# Patient Record
Sex: Female | Born: 1975 | Hispanic: Yes | Marital: Married | Smoking: Never smoker
Health system: Southern US, Community
[De-identification: ages and names within clinical notes are randomized; demographics above are authoritative.]

## PROBLEM LIST (undated history)

## (undated) DIAGNOSIS — O24419 Gestational diabetes mellitus in pregnancy, unspecified control: Secondary | ICD-10-CM

## (undated) DIAGNOSIS — E785 Hyperlipidemia, unspecified: Secondary | ICD-10-CM

## (undated) HISTORY — DX: Gestational diabetes mellitus in pregnancy, unspecified control: O24.419

---

## 2009-09-21 HISTORY — PX: WISDOM TOOTH EXTRACTION: SHX21

## 2010-09-21 NOTE — L&D Delivery Note (Signed)
Delivery Note At 5:28 PM a viable female was delivered via Vaginal, Vacuum (Extractor) (Presentation: Right Occiput Anterior).  APGAR: 9, 9; weight 7 lb 10.8 oz (3480 g).   Placenta status: Intact, Spontaneous.  Cord: 3 vessels with the following complications: None.  Cord pH: not done  Anesthesia: Epidural Local  Episiotomy: Median Lacerations:  Suture Repair: 2.0 vicryl Est. Blood Loss (mL):   Mom to postpartum.  Baby to nursery-stable.  MARSHALL,BERNARD A 06/01/2011, 5:44 PM

## 2010-10-16 ENCOUNTER — Other Ambulatory Visit: Payer: Self-pay | Admitting: Obstetrics and Gynecology

## 2010-10-16 ENCOUNTER — Ambulatory Visit
Admission: RE | Admit: 2010-10-16 | Discharge: 2010-10-16 | Payer: Self-pay | Source: Home / Self Care | Attending: Obstetrics and Gynecology | Admitting: Obstetrics and Gynecology

## 2010-10-16 DIAGNOSIS — O3680X0 Pregnancy with inconclusive fetal viability, not applicable or unspecified: Secondary | ICD-10-CM

## 2010-10-17 NOTE — Progress Notes (Unsigned)
NAME:  Emma Beard, Emma Beard NO.:  [id number]  MEDICAL RECORD NO.:  [id number]          PATIENT TYPE:  WOC  LOCATION:  [facility]                   FACILITY:  [facility]  PHYSICIAN:  Argentina Donovan, MD        DATE OF BIRTH:  [birth year]  DATE OF SERVICE:  10/16/2010                                 CLINIC NOTE  The patient is a 35 year old Spanish-Speaking Hispanic female [facility] because of possible fibroids before we got a chance to see her and her pregnancy test which was positive.  She will be sent for an ultrasound to determine the dates in 2 day and referred back [facility] for care.  IMPRESSION:  Pregnancy.          ______________________________ Argentina Donovan, MD    PR/MEDQ  D:  10/16/2010  T:  10/17/2010  Job:  [id number]

## 2010-10-23 ENCOUNTER — Ambulatory Visit (HOSPITAL_COMMUNITY)
Admission: RE | Admit: 2010-10-23 | Discharge: 2010-10-23 | Disposition: A | Discharge status: Home / Self Care | Payer: Self-pay | Source: Ambulatory Visit | Attending: Obstetrics and Gynecology | Admitting: Obstetrics and Gynecology

## 2010-10-23 DIAGNOSIS — O09519 Supervision of elderly primigravida, unspecified trimester: Secondary | ICD-10-CM | POA: Insufficient documentation

## 2010-10-23 DIAGNOSIS — Z3689 Encounter for other specified antenatal screening: Secondary | ICD-10-CM | POA: Insufficient documentation

## 2010-10-23 DIAGNOSIS — O3680X0 Pregnancy with inconclusive fetal viability, not applicable or unspecified: Secondary | ICD-10-CM

## 2010-12-26 LAB — RPR: RPR: NONREACTIVE

## 2010-12-26 LAB — GC/CHLAMYDIA PROBE AMP, GENITAL
Chlamydia: NEGATIVE
Gonorrhea: NEGATIVE

## 2010-12-26 LAB — RUBELLA ANTIBODY, IGM: Rubella: IMMUNE

## 2010-12-26 LAB — HEPATITIS B SURFACE ANTIGEN: Hepatitis B Surface Ag: NEGATIVE

## 2011-02-19 ENCOUNTER — Other Ambulatory Visit (HOSPITAL_COMMUNITY): Payer: Self-pay | Admitting: Obstetrics

## 2011-02-19 DIAGNOSIS — O24419 Gestational diabetes mellitus in pregnancy, unspecified control: Secondary | ICD-10-CM

## 2011-03-03 ENCOUNTER — Other Ambulatory Visit (HOSPITAL_COMMUNITY): Payer: Self-pay | Admitting: Obstetrics

## 2011-03-03 ENCOUNTER — Ambulatory Visit (HOSPITAL_COMMUNITY)
Admission: RE | Admit: 2011-03-03 | Discharge: 2011-03-03 | Disposition: A | Discharge status: Home / Self Care | Payer: Self-pay | Source: Ambulatory Visit | Attending: Obstetrics | Admitting: Obstetrics

## 2011-03-03 DIAGNOSIS — Z1389 Encounter for screening for other disorder: Secondary | ICD-10-CM | POA: Insufficient documentation

## 2011-03-03 DIAGNOSIS — O358XX0 Maternal care for other (suspected) fetal abnormality and damage, not applicable or unspecified: Secondary | ICD-10-CM | POA: Insufficient documentation

## 2011-03-03 DIAGNOSIS — O24419 Gestational diabetes mellitus in pregnancy, unspecified control: Secondary | ICD-10-CM

## 2011-03-03 DIAGNOSIS — Z363 Encounter for antenatal screening for malformations: Secondary | ICD-10-CM | POA: Insufficient documentation

## 2011-03-03 DIAGNOSIS — O09519 Supervision of elderly primigravida, unspecified trimester: Secondary | ICD-10-CM | POA: Insufficient documentation

## 2011-03-03 DIAGNOSIS — O99810 Abnormal glucose complicating pregnancy: Secondary | ICD-10-CM | POA: Insufficient documentation

## 2011-03-12 ENCOUNTER — Ambulatory Visit (HOSPITAL_COMMUNITY)
Admission: RE | Admit: 2011-03-12 | Discharge: 2011-03-12 | Disposition: A | Discharge status: Home / Self Care | Payer: Self-pay | Source: Ambulatory Visit | Attending: Obstetrics | Admitting: Obstetrics

## 2011-03-12 ENCOUNTER — Ambulatory Visit (HOSPITAL_COMMUNITY): Admission: RE | Admit: 2011-03-12 | Payer: Self-pay | Source: Ambulatory Visit

## 2011-03-12 ENCOUNTER — Encounter: Payer: Self-pay | Attending: Obstetrics | Admitting: Dietician

## 2011-03-12 DIAGNOSIS — O99810 Abnormal glucose complicating pregnancy: Secondary | ICD-10-CM | POA: Insufficient documentation

## 2011-03-12 DIAGNOSIS — Z713 Dietary counseling and surveillance: Secondary | ICD-10-CM | POA: Insufficient documentation

## 2011-03-31 ENCOUNTER — Ambulatory Visit (HOSPITAL_COMMUNITY): Payer: Self-pay

## 2011-04-30 LAB — HIV ANTIBODY (ROUTINE TESTING W REFLEX): HIV: NONREACTIVE

## 2011-04-30 LAB — STREP B DNA PROBE: GBS: NEGATIVE

## 2011-05-26 ENCOUNTER — Telehealth (HOSPITAL_COMMUNITY): Payer: Self-pay | Admitting: *Deleted

## 2011-05-26 ENCOUNTER — Encounter (HOSPITAL_COMMUNITY): Payer: Self-pay | Admitting: *Deleted

## 2011-05-26 NOTE — Telephone Encounter (Signed)
Preadmission screen  

## 2011-05-27 ENCOUNTER — Encounter (HOSPITAL_COMMUNITY): Payer: Self-pay | Admitting: *Deleted

## 2011-05-31 ENCOUNTER — Inpatient Hospital Stay (HOSPITAL_COMMUNITY)
Admission: RE | Admit: 2011-05-31 | Discharge: 2011-06-03 | DRG: 775 | Disposition: A | Discharge status: Home / Self Care | Payer: Medicaid Other | Source: Ambulatory Visit | Attending: Obstetrics | Admitting: Obstetrics

## 2011-05-31 ENCOUNTER — Encounter (HOSPITAL_COMMUNITY): Payer: Self-pay

## 2011-05-31 DIAGNOSIS — O99814 Abnormal glucose complicating childbirth: Secondary | ICD-10-CM | POA: Diagnosis present

## 2011-05-31 DIAGNOSIS — O09519 Supervision of elderly primigravida, unspecified trimester: Secondary | ICD-10-CM | POA: Diagnosis present

## 2011-05-31 LAB — CBC
Hemoglobin: 10.9 g/dL — ABNORMAL LOW (ref 12.0–15.0)
MCHC: 32.7 g/dL (ref 30.0–36.0)
Platelets: 292 10*3/uL (ref 150–400)

## 2011-05-31 MED ORDER — TERBUTALINE SULFATE 1 MG/ML IJ SOLN: 0.2500 mg | Freq: Once | INTRAMUSCULAR | Status: AC | PRN

## 2011-05-31 MED ORDER — ONDANSETRON HCL 4 MG/2ML IJ SOLN: 4.0000 mg | Freq: Four times a day (QID) | INTRAMUSCULAR | Status: DC | PRN

## 2011-05-31 MED ORDER — LACTATED RINGERS IV SOLN: 500.0000 mL | INTRAVENOUS | Status: DC | PRN

## 2011-05-31 MED ORDER — OXYCODONE-ACETAMINOPHEN 5-325 MG PO TABS
2.0000 | ORAL_TABLET | ORAL | Status: DC | PRN
  Administered 2011-06-02: 1 via ORAL

## 2011-05-31 MED ORDER — FLEET ENEMA 7-19 GM/118ML RE ENEM: 1.0000 | ENEMA | RECTAL | Status: DC | PRN

## 2011-05-31 MED ORDER — LIDOCAINE HCL (PF) 1 % IJ SOLN
30.0000 mL | INTRAMUSCULAR | Status: DC | PRN
  Filled 2011-05-31: qty 30

## 2011-05-31 MED ORDER — LACTATED RINGERS IV SOLN
INTRAVENOUS | Status: DC
  Administered 2011-05-31: 21:00:00 via INTRAVENOUS
  Administered 2011-06-01: 125 mL/h via INTRAVENOUS
  Administered 2011-06-01: 999 mL/h via INTRAVENOUS

## 2011-05-31 MED ORDER — ZOLPIDEM TARTRATE 10 MG PO TABS: 10.0000 mg | ORAL_TABLET | Freq: Every evening | ORAL | Status: DC | PRN

## 2011-05-31 MED ORDER — CITRIC ACID-SODIUM CITRATE 334-500 MG/5ML PO SOLN: 30.0000 mL | ORAL | Status: DC | PRN

## 2011-05-31 MED ORDER — OXYTOCIN BOLUS FROM INFUSION
500.0000 mL | Freq: Once | INTRAVENOUS | Status: DC
  Filled 2011-05-31: qty 1000
  Filled 2011-05-31: qty 500

## 2011-05-31 MED ORDER — MISOPROSTOL 25 MCG QUARTER TABLET
25.0000 ug | ORAL_TABLET | ORAL | Status: DC | PRN
  Administered 2011-05-31 – 2011-06-01 (×2): 25 ug via VAGINAL
  Filled 2011-05-31 (×2): qty 0.25

## 2011-05-31 MED ORDER — IBUPROFEN 600 MG PO TABS: 600.0000 mg | ORAL_TABLET | Freq: Four times a day (QID) | ORAL | Status: DC | PRN

## 2011-05-31 MED ORDER — ACETAMINOPHEN 325 MG PO TABS: 650.0000 mg | ORAL_TABLET | ORAL | Status: DC | PRN

## 2011-05-31 NOTE — Progress Notes (Signed)
Called MD for orders for IOL. Orders received.

## 2011-06-01 ENCOUNTER — Inpatient Hospital Stay (HOSPITAL_COMMUNITY): Payer: Medicaid Other | Admitting: Anesthesiology

## 2011-06-01 ENCOUNTER — Encounter (HOSPITAL_COMMUNITY): Payer: Self-pay | Admitting: Anesthesiology

## 2011-06-01 LAB — RPR: RPR Ser Ql: NONREACTIVE

## 2011-06-01 LAB — GLUCOSE, CAPILLARY: Glucose-Capillary: 73 mg/dL (ref 70–99)

## 2011-06-01 MED ORDER — OXYCODONE-ACETAMINOPHEN 5-325 MG PO TABS: 1.0000 | ORAL_TABLET | ORAL | Status: DC | PRN

## 2011-06-01 MED ORDER — FENTANYL 2.5 MCG/ML BUPIVACAINE 1/10 % EPIDURAL INFUSION (WH - ANES)
14.0000 mL/h | INTRAMUSCULAR | Status: DC
  Administered 2011-06-01 (×2): 14 mL/h via EPIDURAL
  Filled 2011-06-01 (×2): qty 60

## 2011-06-01 MED ORDER — PHENYLEPHRINE 40 MCG/ML (10ML) SYRINGE FOR IV PUSH (FOR BLOOD PRESSURE SUPPORT)
80.0000 ug | PREFILLED_SYRINGE | INTRAVENOUS | Status: DC | PRN
  Filled 2011-06-01: qty 5

## 2011-06-01 MED ORDER — ONDANSETRON HCL 4 MG/2ML IJ SOLN: 4.0000 mg | INTRAMUSCULAR | Status: DC | PRN

## 2011-06-01 MED ORDER — BENZOCAINE-MENTHOL 20-0.5 % EX AERO: 1.0000 "application " | INHALATION_SPRAY | CUTANEOUS | Status: DC | PRN

## 2011-06-01 MED ORDER — DIBUCAINE 1 % RE OINT: 1.0000 "application " | TOPICAL_OINTMENT | RECTAL | Status: DC | PRN

## 2011-06-01 MED ORDER — LACTATED RINGERS IV SOLN: 500.0000 mL | Freq: Once | INTRAVENOUS | Status: DC

## 2011-06-01 MED ORDER — DIPHENHYDRAMINE HCL 50 MG/ML IJ SOLN: 12.5000 mg | INTRAMUSCULAR | Status: DC | PRN

## 2011-06-01 MED ORDER — DIPHENHYDRAMINE HCL 25 MG PO CAPS: 25.0000 mg | ORAL_CAPSULE | Freq: Four times a day (QID) | ORAL | Status: DC | PRN

## 2011-06-01 MED ORDER — FERROUS SULFATE 325 (65 FE) MG PO TABS
325.0000 mg | ORAL_TABLET | Freq: Two times a day (BID) | ORAL | Status: DC
  Administered 2011-06-02 – 2011-06-03 (×3): 325 mg via ORAL
  Filled 2011-06-01 (×3): qty 1

## 2011-06-01 MED ORDER — EPHEDRINE 5 MG/ML INJ
10.0000 mg | INTRAVENOUS | Status: DC | PRN
  Filled 2011-06-01: qty 4

## 2011-06-01 MED ORDER — OXYTOCIN 20 UNITS IN LACTATED RINGERS INFUSION - SIMPLE
999.0000 mL/h | Freq: Once | INTRAVENOUS | Status: AC
  Administered 2011-06-01: 999 mL/h via INTRAVENOUS

## 2011-06-01 MED ORDER — WITCH HAZEL-GLYCERIN EX PADS: 1.0000 "application " | MEDICATED_PAD | CUTANEOUS | Status: DC | PRN

## 2011-06-01 MED ORDER — ZOLPIDEM TARTRATE 5 MG PO TABS: 5.0000 mg | ORAL_TABLET | Freq: Every evening | ORAL | Status: DC | PRN

## 2011-06-01 MED ORDER — PRENATAL PLUS 27-1 MG PO TABS
1.0000 | ORAL_TABLET | Freq: Every day | ORAL | Status: DC
  Administered 2011-06-02 – 2011-06-03 (×2): 1 via ORAL
  Filled 2011-06-01 (×3): qty 1

## 2011-06-01 MED ORDER — EPHEDRINE 5 MG/ML INJ: 10.0000 mg | INTRAVENOUS | Status: DC | PRN

## 2011-06-01 MED ORDER — IBUPROFEN 600 MG PO TABS
600.0000 mg | ORAL_TABLET | Freq: Four times a day (QID) | ORAL | Status: DC
  Administered 2011-06-01 – 2011-06-03 (×7): 600 mg via ORAL
  Filled 2011-06-01 (×7): qty 1

## 2011-06-01 MED ORDER — LANOLIN HYDROUS EX OINT: TOPICAL_OINTMENT | CUTANEOUS | Status: DC | PRN

## 2011-06-01 MED ORDER — PHENYLEPHRINE 40 MCG/ML (10ML) SYRINGE FOR IV PUSH (FOR BLOOD PRESSURE SUPPORT): 80.0000 ug | PREFILLED_SYRINGE | INTRAVENOUS | Status: DC | PRN

## 2011-06-01 MED ORDER — TETANUS-DIPHTH-ACELL PERTUSSIS 5-2.5-18.5 LF-MCG/0.5 IM SUSP
0.5000 mL | Freq: Once | INTRAMUSCULAR | Status: AC
  Administered 2011-06-02: 0.5 mL via INTRAMUSCULAR
  Filled 2011-06-01: qty 0.5

## 2011-06-01 MED ORDER — SIMETHICONE 80 MG PO CHEW: 80.0000 mg | CHEWABLE_TABLET | ORAL | Status: DC | PRN

## 2011-06-01 MED ORDER — ONDANSETRON HCL 4 MG PO TABS: 4.0000 mg | ORAL_TABLET | ORAL | Status: DC | PRN

## 2011-06-01 MED ORDER — LIDOCAINE HCL 1.5 % IJ SOLN
INTRAMUSCULAR | Status: DC | PRN
  Administered 2011-06-01 (×2): 5 mL via EPIDURAL

## 2011-06-01 MED ORDER — SENNOSIDES-DOCUSATE SODIUM 8.6-50 MG PO TABS
2.0000 | ORAL_TABLET | Freq: Every day | ORAL | Status: DC
  Administered 2011-06-02: 2 via ORAL

## 2011-06-01 NOTE — Anesthesia Preprocedure Evaluation (Signed)
Anesthesia Evaluation  Name, MR# and DOB Patient awake  General Assessment Comment  Reviewed: Allergy & Precautions, H&P , NPO status , Patient's Chart, lab work & pertinent test results, reviewed documented beta blocker date and time   History of Anesthesia Complications Negative for: history of anesthetic complications  Airway Mallampati: II TM Distance: >3 FB Neck ROM: full    Dental  (+) Teeth Intact   Pulmonary  clear to auscultation  breath sounds clear to auscultation none    Cardiovascular regular Normal    Neuro/Psych Negative Neurological ROS  Negative Psych ROS  GI/Hepatic/Renal negative GI ROS  negative Liver ROS  negative Renal ROS        Endo/Other  (+) Diabetes mellitus-, Gestational,     Abdominal   Musculoskeletal   Hematology negative hematology ROS (+)   Peds  Reproductive/Obstetrics (+) Pregnancy    Anesthesia Other Findings             Anesthesia Physical Anesthesia Plan  ASA: II  Anesthesia Plan: Epidural   Post-op Pain Management:    Induction:   Airway Management Planned:   Additional Equipment:   Intra-op Plan:   Post-operative Plan:   Informed Consent: I have reviewed the patients History and Physical, chart, labs and discussed the procedure including the risks, benefits and alternatives for the proposed anesthesia with the patient or authorized representative who has indicated his/her understanding and acceptance.     Plan Discussed with:   Anesthesia Plan Comments:         Anesthesia Quick Evaluation

## 2011-06-01 NOTE — Progress Notes (Signed)
  Cervix is now 9 cm with the vertex at a plus one station Reactive tracing

## 2011-06-01 NOTE — Anesthesia Procedure Notes (Signed)
Epidural Patient location during procedure: OB Start time: 06/01/2011 8:01 AM Reason for block: procedure for pain  Staffing Performed by: anesthesiologist   Preanesthetic Checklist Completed: patient identified, site marked, surgical consent, pre-op evaluation, timeout performed, IV checked, risks and benefits discussed and monitors and equipment checked  Epidural Patient position: sitting Prep: site prepped and draped and DuraPrep Patient monitoring: continuous pulse ox and blood pressure Approach: midline Injection technique: LOR air  Needle:  Needle type: Tuohy  Needle gauge: 17 G Needle length: 9 cm Needle insertion depth: 4 cm Catheter type: closed end flexible Catheter size: 19 Gauge Catheter at skin depth: 9 cm Test dose: negative  Assessment Events: blood not aspirated, injection not painful, no injection resistance, negative IV test and no paresthesia  Additional Notes Discussed risk of headache, infection, bleeding, nerve injury and failed or incomplete block.  Patient voices understanding and wishes to proceed.  Consent and procedure with interpreter present.

## 2011-06-01 NOTE — Progress Notes (Addendum)
Spanish interpreter, Albertina Senegal in room helping translate for RN and pt. Rn giving pt instructions to help with pushing. Plan is to start pushing

## 2011-06-01 NOTE — H&P (Signed)
This is Dr. Francoise Ceo dictating the history and physical on  Emma Beard she's a 35 year old gravida 1 EDC 9 9/12 she's a gestational diabetic Controlled at that was brought in for induction She received a him Cytotec x2 on admission and now her cervix is 3 cm 90% with the vertex at 0 station Amniotomy was performed and the fluid was thin meconium She's a negative GBS unknown known allergies Past medical history as stated above a gestational diabetes with normal she does Past surgical history negative Social history not negative System review negative Physical exam Well-developed female in no distress HEENT negative Lungs clear Heart regular rhythm no murmurs no gallops Breasts negative Abdomen term Extremities negative and

## 2011-06-01 NOTE — Progress Notes (Signed)
RN discussed with MD pt's sve and FHR baseline change. Plan is to start pushing.

## 2011-06-02 ENCOUNTER — Encounter (HOSPITAL_COMMUNITY): Payer: Self-pay

## 2011-06-02 LAB — CBC
HCT: 30.7 % — ABNORMAL LOW (ref 36.0–46.0)
RDW: 15.3 % (ref 11.5–15.5)
WBC: 19.1 10*3/uL — ABNORMAL HIGH (ref 4.0–10.5)

## 2011-06-02 LAB — ABO/RH: ABO/RH(D): O POS

## 2011-06-02 MED ORDER — INFLUENZA VIRUS VACC SPLIT PF IM SUSP
0.5000 mL | Freq: Once | INTRAMUSCULAR | Status: AC
  Administered 2011-06-02: 0.5 mL via INTRAMUSCULAR
  Filled 2011-06-02: qty 0.5

## 2011-06-02 NOTE — Progress Notes (Signed)
UR chart review completed.  

## 2011-06-02 NOTE — Anesthesia Postprocedure Evaluation (Signed)
  Anesthesia Post-op Note  Patient: Emma Beard  Procedure(s) Performed: * No procedures listed *  Patient Location: PACU and Mother/Baby  Anesthesia Type: Epidural  Level of Consciousness: awake, alert  and oriented  Airway and Oxygen Therapy: Patient Spontanous Breathing  Post-op Pain: none  Post-op Assessment: Post-op Vital signs reviewed and Patient's Cardiovascular Status Stable  Post-op Vital Signs: Reviewed and stable  Complications: No apparent anesthesia complications

## 2011-06-02 NOTE — Progress Notes (Signed)
Postpartum day one Vital signs normal Fundus firm Lochia moderate Legs negative No complaints    Postpartum day one Vital signs normal Fundus firm Lochia moderate Legs negative No complaints

## 2011-06-03 NOTE — Discharge Summary (Signed)
Obstetric Discharge Summary Reason for Admission: onset of labor Prenatal Procedures: none Intrapartum Procedures: spontaneous vaginal delivery Postpartum Procedures: none Complications-Operative and Postpartum: none Hemoglobin  Date Value Range Status  06/02/2011 9.8* 12.0-15.0 (g/dL) Final     HCT  Date Value Range Status  06/02/2011 30.7* 36.0-46.0 (%) Final    Discharge Diagnoses: Term Pregnancy-delivered  Discharge Information: Date: 06/03/2011 Activity: pelvic rest Diet: routine Medications: Tylenol #3 Condition: stable Instructions: refer to practice specific booklet Discharge to: home   Newborn Data: Live born female  Birth Weight: 7 lb 10.8 oz (3480 g) APGAR: 9, 9  Home with mother.  MARSHALL,BERNARD A 06/03/2011, 7:15 AM

## 2011-06-12 ENCOUNTER — Encounter (HOSPITAL_COMMUNITY): Payer: Self-pay

## 2011-06-12 NOTE — Progress Notes (Signed)
Encounter addended by: Catha Nottingham, RN on: 06/12/2011  8:29 AM<BR>     Documentation filed: Chief Complaint Section

## 2011-09-11 ENCOUNTER — Emergency Department (HOSPITAL_COMMUNITY)
Admission: EM | Admit: 2011-09-11 | Discharge: 2011-09-11 | Disposition: A | Discharge status: Home / Self Care | Payer: Self-pay | Attending: Emergency Medicine | Admitting: Emergency Medicine

## 2011-09-11 ENCOUNTER — Encounter (HOSPITAL_COMMUNITY): Payer: Self-pay | Admitting: *Deleted

## 2011-09-11 ENCOUNTER — Emergency Department (HOSPITAL_COMMUNITY): Payer: Self-pay

## 2011-09-11 DIAGNOSIS — R10819 Abdominal tenderness, unspecified site: Secondary | ICD-10-CM | POA: Insufficient documentation

## 2011-09-11 DIAGNOSIS — R109 Unspecified abdominal pain: Secondary | ICD-10-CM | POA: Insufficient documentation

## 2011-09-11 DIAGNOSIS — M549 Dorsalgia, unspecified: Secondary | ICD-10-CM | POA: Insufficient documentation

## 2011-09-11 DIAGNOSIS — K8020 Calculus of gallbladder without cholecystitis without obstruction: Secondary | ICD-10-CM | POA: Insufficient documentation

## 2011-09-11 LAB — DIFFERENTIAL
Eosinophils Absolute: 0 10*3/uL (ref 0.0–0.7)
Eosinophils Relative: 0 % (ref 0–5)
Lymphocytes Relative: 16 % (ref 12–46)
Lymphs Abs: 1.9 10*3/uL (ref 0.7–4.0)
Monocytes Absolute: 0.7 10*3/uL (ref 0.1–1.0)

## 2011-09-11 LAB — COMPREHENSIVE METABOLIC PANEL
BUN: 15 mg/dL (ref 6–23)
CO2: 23 mEq/L (ref 19–32)
Calcium: 9.5 mg/dL (ref 8.4–10.5)
Creatinine, Ser: 0.62 mg/dL (ref 0.50–1.10)
GFR calc Af Amer: >90 mL/min (ref >90)
GFR calc non Af Amer: >90 mL/min (ref >90)
Glucose, Bld: 121 mg/dL — ABNORMAL HIGH (ref 70–99)
Total Protein: 6.8 g/dL (ref 6.0–8.3)

## 2011-09-11 LAB — CBC
HCT: 36.6 % (ref 36.0–46.0)
MCH: 24.2 pg — ABNORMAL LOW (ref 26.0–34.0)
MCV: 74.5 fL — ABNORMAL LOW (ref 78.0–100.0)
RBC: 4.91 MIL/uL (ref 3.87–5.11)
WBC: 12.3 10*3/uL — ABNORMAL HIGH (ref 4.0–10.5)

## 2011-09-11 LAB — URINALYSIS, ROUTINE W REFLEX MICROSCOPIC
Bilirubin Urine: NEGATIVE
Ketones, ur: NEGATIVE mg/dL
Leukocytes, UA: NEGATIVE
Nitrite: NEGATIVE
Specific Gravity, Urine: 1.022 (ref 1.005–1.030)
Urobilinogen, UA: 0.2 mg/dL (ref 0.0–1.0)
pH: 5 (ref 5.0–8.0)

## 2011-09-11 LAB — URINE MICROSCOPIC-ADD ON

## 2011-09-11 LAB — LIPASE, BLOOD: Lipase: 55 U/L (ref 11–59)

## 2011-09-11 MED ORDER — OXYCODONE-ACETAMINOPHEN 5-325 MG PO TABS
1.0000 | ORAL_TABLET | Freq: Once | ORAL | Status: AC
  Administered 2011-09-11: 1 via ORAL
  Filled 2011-09-11: qty 1

## 2011-09-11 MED ORDER — IBUPROFEN 600 MG PO TABS: 600.0000 mg | ORAL_TABLET | Freq: Four times a day (QID) | ORAL | Status: AC | PRN

## 2011-09-11 MED ORDER — ONDANSETRON 8 MG PO TBDP: 8.0000 mg | ORAL_TABLET | Freq: Three times a day (TID) | ORAL | Status: DC | PRN

## 2011-09-11 MED ORDER — ONDANSETRON 4 MG PO TBDP
8.0000 mg | ORAL_TABLET | Freq: Once | ORAL | Status: AC
  Administered 2011-09-11: 8 mg via ORAL
  Filled 2011-09-11: qty 1

## 2011-09-11 MED ORDER — HYDROCODONE-ACETAMINOPHEN 5-500 MG PO TABS: 1.0000 | ORAL_TABLET | Freq: Four times a day (QID) | ORAL | Status: DC | PRN

## 2011-09-11 MED ORDER — IBUPROFEN 200 MG PO TABS
600.0000 mg | ORAL_TABLET | Freq: Once | ORAL | Status: AC
  Administered 2011-09-11: 600 mg via ORAL
  Filled 2011-09-11: qty 2
  Filled 2011-09-11: qty 1

## 2011-09-11 NOTE — ED Notes (Signed)
Had pain since having a baby 3 months ago.

## 2011-09-11 NOTE — ED Notes (Signed)
Per interpreter phone.  Pt woke up at 1am with generalized abdominal pain and back pain.  Pt denies vomiting or diarrhea.  Denies urinary symptoms.  Ambulatory without difficulty.  No distress noted.

## 2011-09-11 NOTE — ED Provider Notes (Signed)
History     CSN: [id number]  Arrival date & time 09/11/11  0509   First MD Initiated Contact with Patient 09/11/11 [phone]      Chief Complaint  Patient presents with  . Abdominal Pain    (Consider location/radiation/quality/duration/timing/severity/associated sxs/prior treatment) Patient is a 35 y.o. female presenting with abdominal pain. The history is provided by the patient.  Abdominal Pain The primary symptoms of the illness include abdominal pain. The primary symptoms of the illness do not include fever, shortness of breath, nausea, vomiting, diarrhea or dysuria. The current episode started 3 to 5 hours ago. The onset of the illness was sudden. The problem has been gradually improving.  The illness is associated with awakening from sleep. The patient states that she believes she is currently not pregnant. Additional symptoms associated with the illness include back pain. Symptoms associated with the illness do not include chills, anorexia, constipation, urgency or frequency.  Pt reports generalized abdominal and back pain in the middle of the night. States most of the pain however is on the right upper abdomen and right back. States she has had this type of pain in the past few times, but never this severe. States these episodes started 3 mon ago after she delivered her child. Denies fever, chill, nausea, vomiting, urinary symptoms, diarrhea, constipation  Past Medical History  Diagnosis Date  . Gestational diabetes     Past Surgical History  Procedure Date  . Wisdom tooth extraction 2011    Family History  Problem Relation Age of Onset  . Diabetes Mother     died from diabetes  . Heart disease Father     died from heart murmur  . Diabetes Brother     died from diabetes    History  Substance Use Topics  . Smoking status: Never Smoker   . Smokeless tobacco: Not on file  . Alcohol Use: No    OB History    Grav Para Term Preterm Abortions TAB SAB Ect Mult Living   1  1 1       1       Review of Systems  Constitutional: Negative for fever and chills.  HENT: Negative.   Eyes: Negative.   Respiratory: Negative for cough, chest tightness and shortness of breath.   Cardiovascular: Negative for chest pain.  Gastrointestinal: Positive for abdominal pain. Negative for nausea, vomiting, diarrhea, constipation and anorexia.  Genitourinary: Positive for flank pain. Negative for dysuria, urgency and frequency.  Musculoskeletal: Positive for back pain.  Neurological: Negative.   Psychiatric/Behavioral: Negative.     Allergies  Review of patient's allergies indicates no known allergies.  Home Medications   Current Outpatient Rx  Name Route Sig Dispense Refill  . PRENATAL VITAMINS PO Oral Take 1 tablet by mouth daily.        BP 121/86  Pulse 71  Temp(Src) 98.4 F (36.9 C) (Oral)  Resp 16  SpO2 100%  Breastfeeding? Unknown  Physical Exam  Nursing note and vitals reviewed. Constitutional: She is oriented to person, place, and time. She appears well-developed and well-nourished. No distress.  Neck: Neck supple.  Cardiovascular: Normal rate, regular rhythm and normal heart sounds.   Pulmonary/Chest: Breath sounds normal. No respiratory distress. She has no wheezes.  Abdominal: Soft. Normal appearance and bowel sounds are normal. There is tenderness in the right upper quadrant and epigastric area. There is guarding, CVA tenderness and positive Murphy's sign. There is no rigidity.  Musculoskeletal: Normal range of motion.  Lymphadenopathy:  She has no cervical adenopathy.  Neurological: She is alert and oriented to person, place, and time.  Skin: Skin is warm and dry.  Psychiatric: She has a normal mood and affect.    ED Course  Procedures (including critical care time)  6:28 AM Pt with RUQ radiating to the right back. Pt is 3 months post partum. Suspect billiary colic, will work up to r/o cholecystitis. Pt's VS are normal, she is refusing pain  medications, stating her pain is "calming down."   9:39 AM Pain controlled. Normal LFTs. Biliary colic. Will d/c home with surgery follow up.  Results for orders placed during the hospital encounter of 09/11/11  URINALYSIS, ROUTINE W REFLEX MICROSCOPIC      Component Value Range   Color, Urine YELLOW  YELLOW    APPearance CLOUDY (*) CLEAR    Specific Gravity, Urine 1.022  1.005 - 1.030    pH 5.0  5.0 - 8.0    Glucose, UA NEGATIVE  NEGATIVE (mg/dL)   Hgb urine dipstick TRACE (*) NEGATIVE    Bilirubin Urine NEGATIVE  NEGATIVE    Ketones, ur NEGATIVE  NEGATIVE (mg/dL)   Protein, ur NEGATIVE  NEGATIVE (mg/dL)   Urobilinogen, UA 0.2  0.0 - 1.0 (mg/dL)   Nitrite NEGATIVE  NEGATIVE    Leukocytes, UA NEGATIVE  NEGATIVE   POCT PREGNANCY, URINE      Component Value Range   Preg Test, Ur NEGATIVE    URINE MICROSCOPIC-ADD ON      Component Value Range   Squamous Epithelial / LPF MANY (*) RARE    WBC, UA 3-6  <3 (WBC/hpf)   RBC / HPF 3-6  <3 (RBC/hpf)   Bacteria, UA FEW (*) RARE   CBC      Component Value Range   WBC 12.3 (*) 4.0 - 10.5 (K/uL)   RBC 4.91  3.87 - 5.11 (MIL/uL)   Hemoglobin 11.9 (*) 12.0 - 15.0 (g/dL)   HCT 91.4  78.2 - 95.6 (%)   MCV 74.5 (*) 78.0 - 100.0 (fL)   MCH 24.2 (*) 26.0 - 34.0 (pg)   MCHC 32.5  30.0 - 36.0 (g/dL)   RDW 21.3 (*) 08.6 - 15.5 (%)   Platelets 245  150 - 400 (K/uL)  DIFFERENTIAL      Component Value Range   Neutrophils Relative 78 (*) 43 - 77 (%)   Neutro Abs 9.6 (*) 1.7 - 7.7 (K/uL)   Lymphocytes Relative 16  12 - 46 (%)   Lymphs Abs 1.9  0.7 - 4.0 (K/uL)   Monocytes Relative 6  3 - 12 (%)   Monocytes Absolute 0.7  0.1 - 1.0 (K/uL)   Eosinophils Relative 0  0 - 5 (%)   Eosinophils Absolute 0.0  0.0 - 0.7 (K/uL)   Basophils Relative 0  0 - 1 (%)   Basophils Absolute 0.0  0.0 - 0.1 (K/uL)  COMPREHENSIVE METABOLIC PANEL      Component Value Range   Sodium 137  135 - 145 (mEq/L)   Potassium 4.1  3.5 - 5.1 (mEq/L)   Chloride 103  96 - 112  (mEq/L)   CO2 23  19 - 32 (mEq/L)   Glucose, Bld 121 (*) 70 - 99 (mg/dL)   BUN 15  6 - 23 (mg/dL)   Creatinine, Ser 5.78  0.50 - 1.10 (mg/dL)   Calcium 9.5  8.4 - 46.9 (mg/dL)   Total Protein 6.8  6.0 - 8.3 (g/dL)   Albumin 3.6  3.5 -  5.2 (g/dL)   AST 21  0 - 37 (U/L)   ALT 29  0 - 35 (U/L)   Alkaline Phosphatase 82  39 - 117 (U/L)   Total Bilirubin 0.1 (*) 0.3 - 1.2 (mg/dL)   GFR calc non Af Amer >90  >90 (mL/min)   GFR calc Af Amer >90  >90 (mL/min)  LIPASE, BLOOD      Component Value Range   Lipase 55  11 - 59 (U/L)   US Abdomen Complete  09/11/2011  *RADIOLOGY REPORT*  Clinical Data:  Right upper quadrant pain, postpartum  COMPLETE ABDOMINAL ULTRASOUND  Comparison:  None.  Findings:  Gallbladder:  There are multiple small gallstones which are mobile within the gallbladder with some sludge.  No pain is present over the gallbladder with compression.  Common bile duct:  The common bile duct is normal measuring 3.5 mm in diameter.  Liver:  The liver has a normal echogenic pattern.  No ductal dilatation is seen.  IVC:  Appears normal.  Pancreas:  No focal abnormality seen.  Spleen:  The spleen is normal measuring 5.8 cm sagittally.  Right Kidney:  No hydronephrosis is seen.  The right kidney measures 10.6 cm sagittally.  Left Kidney:  No hydronephrosis.  The left kidney measures 10.5 cm.  Abdominal aorta:  The abdominal aorta is normal in caliber.  IMPRESSION:  1. Multiple small gallstones with gallbladder sludge.  No pain over the gallbladder with compression. 2.  No ductal dilatation. 3.  No hydronephrosis.  Original Report Authenticated By: Juline Patch, M.D.   Pt concerned about taking pain medications because if breast feeding. [facility] Spoke with midwife there, OK to take.   MDM          Lottie Mussel, PA 09/11/11 0940  Lottie Mussel, PA 09/11/11 1029

## 2011-09-15 ENCOUNTER — Inpatient Hospital Stay (HOSPITAL_COMMUNITY)
Admission: EM | Admit: 2011-09-15 | Discharge: 2011-09-19 | DRG: 769 | Disposition: A | Discharge status: Home / Self Care | Payer: Medicaid Other | Attending: General Surgery | Admitting: General Surgery

## 2011-09-15 ENCOUNTER — Encounter (HOSPITAL_COMMUNITY): Payer: Self-pay | Admitting: *Deleted

## 2011-09-15 ENCOUNTER — Emergency Department (HOSPITAL_COMMUNITY): Payer: Medicaid Other

## 2011-09-15 DIAGNOSIS — K8010 Calculus of gallbladder with chronic cholecystitis without obstruction: Secondary | ICD-10-CM

## 2011-09-15 DIAGNOSIS — O24419 Gestational diabetes mellitus in pregnancy, unspecified control: Secondary | ICD-10-CM

## 2011-09-15 DIAGNOSIS — K859 Acute pancreatitis without necrosis or infection, unspecified: Secondary | ICD-10-CM | POA: Diagnosis present

## 2011-09-15 DIAGNOSIS — K851 Biliary acute pancreatitis without necrosis or infection: Secondary | ICD-10-CM

## 2011-09-15 DIAGNOSIS — K828 Other specified diseases of gallbladder: Secondary | ICD-10-CM | POA: Diagnosis present

## 2011-09-15 DIAGNOSIS — K8020 Calculus of gallbladder without cholecystitis without obstruction: Secondary | ICD-10-CM | POA: Diagnosis present

## 2011-09-15 DIAGNOSIS — O9089 Other complications of the puerperium, not elsewhere classified: Principal | ICD-10-CM | POA: Diagnosis present

## 2011-09-15 DIAGNOSIS — R112 Nausea with vomiting, unspecified: Secondary | ICD-10-CM | POA: Diagnosis present

## 2011-09-15 DIAGNOSIS — O99815 Abnormal glucose complicating the puerperium: Secondary | ICD-10-CM | POA: Diagnosis present

## 2011-09-15 LAB — COMPREHENSIVE METABOLIC PANEL
ALT: 887 U/L — ABNORMAL HIGH (ref 0–35)
Albumin: 3.8 g/dL (ref 3.5–5.2)
Alkaline Phosphatase: 151 U/L — ABNORMAL HIGH (ref 39–117)
Chloride: 99 mEq/L (ref 96–112)
GFR calc Af Amer: >90 mL/min (ref >90)
Glucose, Bld: 148 mg/dL — ABNORMAL HIGH (ref 70–99)
Potassium: 4 mEq/L (ref 3.5–5.1)
Sodium: 135 mEq/L (ref 135–145)
Total Protein: 7.3 g/dL (ref 6.0–8.3)

## 2011-09-15 LAB — CBC
MCH: 24.3 pg — ABNORMAL LOW (ref 26.0–34.0)
Platelets: 245 10*3/uL (ref 150–400)
RBC: 5.38 MIL/uL — ABNORMAL HIGH (ref 3.87–5.11)
WBC: 17.4 10*3/uL — ABNORMAL HIGH (ref 4.0–10.5)

## 2011-09-15 LAB — DIFFERENTIAL
Eosinophils Absolute: 0 10*3/uL (ref 0.0–0.7)
Lymphs Abs: 0.8 10*3/uL (ref 0.7–4.0)
Neutro Abs: 16.3 10*3/uL — ABNORMAL HIGH (ref 1.7–7.7)
Neutrophils Relative %: 94 % — ABNORMAL HIGH (ref 43–77)

## 2011-09-15 LAB — URINALYSIS, ROUTINE W REFLEX MICROSCOPIC
Bilirubin Urine: NEGATIVE
Glucose, UA: NEGATIVE mg/dL
Ketones, ur: NEGATIVE mg/dL
pH: 6.5 (ref 5.0–8.0)

## 2011-09-15 MED ORDER — PANTOPRAZOLE SODIUM 40 MG IV SOLR
40.0000 mg | Freq: Every day | INTRAVENOUS | Status: DC
  Administered 2011-09-15 – 2011-09-17 (×3): 40 mg via INTRAVENOUS
  Filled 2011-09-15 (×4): qty 40

## 2011-09-15 MED ORDER — ONDANSETRON HCL 4 MG/2ML IJ SOLN
4.0000 mg | Freq: Once | INTRAMUSCULAR | Status: AC
  Administered 2011-09-15: 4 mg via INTRAVENOUS
  Filled 2011-09-15: qty 2

## 2011-09-15 MED ORDER — SODIUM CHLORIDE 0.9 % IV BOLUS (SEPSIS)
1000.0000 mL | Freq: Once | INTRAVENOUS | Status: AC
  Administered 2011-09-15: 1000 mL via INTRAVENOUS

## 2011-09-15 MED ORDER — HYDROMORPHONE HCL PF 1 MG/ML IJ SOLN
1.0000 mg | Freq: Once | INTRAMUSCULAR | Status: AC
  Administered 2011-09-15: 1 mg via INTRAVENOUS
  Filled 2011-09-15: qty 1

## 2011-09-15 MED ORDER — MORPHINE SULFATE 2 MG/ML IJ SOLN
4.0000 mg | INTRAMUSCULAR | Status: DC | PRN
  Administered 2011-09-16: 4 mg via INTRAVENOUS
  Filled 2011-09-15: qty 2

## 2011-09-15 MED ORDER — METRONIDAZOLE IN NACL 5-0.79 MG/ML-% IV SOLN
500.0000 mg | Freq: Three times a day (TID) | INTRAVENOUS | Status: DC
Start: 2011-09-15 — End: 2011-09-17
  Administered 2011-09-15 – 2011-09-17 (×5): 500 mg via INTRAVENOUS
  Filled 2011-09-15 (×7): qty 100

## 2011-09-15 MED ORDER — KCL IN DEXTROSE-NACL 20-5-0.9 MEQ/L-%-% IV SOLN
INTRAVENOUS | Status: DC
  Administered 2011-09-15 – 2011-09-19 (×6): via INTRAVENOUS
  Filled 2011-09-15 (×11): qty 1000

## 2011-09-15 MED ORDER — ONDANSETRON HCL 4 MG/2ML IJ SOLN: 4.0000 mg | Freq: Four times a day (QID) | INTRAMUSCULAR | Status: DC | PRN

## 2011-09-15 MED ORDER — CIPROFLOXACIN IN D5W 400 MG/200ML IV SOLN
400.0000 mg | Freq: Two times a day (BID) | INTRAVENOUS | Status: DC
  Administered 2011-09-15 – 2011-09-18 (×7): 400 mg via INTRAVENOUS
  Filled 2011-09-15 (×10): qty 200

## 2011-09-15 NOTE — H&P (Signed)
Emma Beard is an 35 y.o. female.   Chief Complaint: abdominal pain HPI: the patient is a 35 year old Hispanic female who does not speak any English who presents with a couple days of upper abnormal pain. The pain has been associated with nausea and vomiting. She denies any fevers or chills. Her history was obtained through an interpreter and she would not give much more information. She is several weeks postpartum.  Past Medical History  Diagnosis Date  . Gestational diabetes     Past Surgical History  Procedure Date  . Wisdom tooth extraction 2011    Family History  Problem Relation Age of Onset  . Diabetes Mother     died from diabetes  . Heart disease Father     died from heart murmur  . Diabetes Brother     died from diabetes   Social History:  reports that she has never smoked. She does not have any smokeless tobacco history on file. She reports that she does not drink alcohol or use illicit drugs.  Allergies: No Known Allergies  Medications Prior to Admission  Medication Dose Route Frequency Provider Last Rate Last Dose  . ciprofloxacin (CIPRO) IVPB 400 mg  400 mg Intravenous Q12H Caleen Essex III, MD      . dextrose 5 % and 0.9 % NaCl with KCl 20 mEq/L infusion   Intravenous Continuous Caleen Essex III, MD      . HYDROmorphone (DILAUDID) injection 1 mg  1 mg Intravenous Once Glynn Octave, MD   1 mg at 09/15/11 1523  . metroNIDAZOLE (FLAGYL) IVPB 500 mg  500 mg Intravenous Q8H Caleen Essex III, MD      . morphine 2 MG/ML injection 4 mg  4 mg Intravenous Q1H PRN Caleen Essex III, MD      . ondansetron [facility]) injection 4 mg  4 mg Intravenous Once Glynn Octave, MD   4 mg at 09/15/11 1522  . ondansetron (ZOFRAN) injection 4 mg  4 mg Intravenous Q6H PRN Caleen Essex III, MD      . pantoprazole (PROTONIX) injection 40 mg  40 mg Intravenous QHS Caleen Essex III, MD      . sodium chloride 0.9 % bolus 1,000 mL  1,000 mL Intravenous Once Glynn Octave, MD   1,000 mL at  09/15/11 1525   Medications Prior to Admission  Medication Sig Dispense Refill  . HYDROcodone-acetaminophen (VICODIN) 5-500 MG per tablet Take 1-2 tablets by mouth every 6 (six) hours as needed for pain.  20 tablet  0  . ibuprofen (ADVIL,MOTRIN) 600 MG tablet Take 1 tablet (600 mg total) by mouth every 6 (six) hours as needed for pain.  30 tablet  0  . ondansetron (ZOFRAN ODT) 8 MG disintegrating tablet Take 1 tablet (8 mg total) by mouth every 8 (eight) hours as needed for nausea.  20 tablet  0  . PRENATAL VITAMINS PO Take 1 tablet by mouth daily.          Results for orders placed during the hospital encounter of 09/15/11 (from the past 48 hour(s))  CBC     Status: Abnormal   Collection Time   09/15/11  3:08 PM      Component Value Range Comment   WBC 17.4 (*) 4.0 - 10.5 (K/uL)    RBC 5.38 (*) 3.87 - 5.11 (MIL/uL)    Hemoglobin 13.1  12.0 - 15.0 (g/dL)    HCT 40.9  81.1 - 91.4 (%)  MCV 73.8 (*) 78.0 - 100.0 (fL)    MCH 24.3 (*) 26.0 - 34.0 (pg)    MCHC 33.0  30.0 - 36.0 (g/dL)    RDW 40.9 (*) 81.1 - 15.5 (%)    Platelets 245  150 - 400 (K/uL)   DIFFERENTIAL     Status: Abnormal   Collection Time   09/15/11  3:08 PM      Component Value Range Comment   Neutrophils Relative 94 (*) 43 - 77 (%)    Neutro Abs 16.3 (*) 1.7 - 7.7 (K/uL)    Lymphocytes Relative 4 (*) 12 - 46 (%)    Lymphs Abs 0.8  0.7 - 4.0 (K/uL)    Monocytes Relative 2 (*) 3 - 12 (%)    Monocytes Absolute 0.3  0.1 - 1.0 (K/uL)    Eosinophils Relative 0  0 - 5 (%)    Eosinophils Absolute 0.0  0.0 - 0.7 (K/uL)    Basophils Relative 0  0 - 1 (%)    Basophils Absolute 0.0  0.0 - 0.1 (K/uL)   COMPREHENSIVE METABOLIC PANEL     Status: Abnormal   Collection Time   09/15/11  3:08 PM      Component Value Range Comment   Sodium 135  135 - 145 (mEq/L)    Potassium 4.0  3.5 - 5.1 (mEq/L)    Chloride 99  96 - 112 (mEq/L)    CO2 26  19 - 32 (mEq/L)    Glucose, Bld 148 (*) 70 - 99 (mg/dL)    BUN 15  6 - 23 (mg/dL)     Creatinine, Ser 9.14 (*) 0.50 - 1.10 (mg/dL)    Calcium 9.1  8.4 - 10.5 (mg/dL)    Total Protein 7.3  6.0 - 8.3 (g/dL)    Albumin 3.8  3.5 - 5.2 (g/dL)    AST 7829 (*) 0 - 37 (U/L)    ALT 887 (*) 0 - 35 (U/L)    Alkaline Phosphatase 151 (*) 39 - 117 (U/L)    Total Bilirubin 1.4 (*) 0.3 - 1.2 (mg/dL)    GFR calc non Af Amer >90  >90 (mL/min)    GFR calc Af Amer >90  >90 (mL/min)   LIPASE, BLOOD     Status: Abnormal   Collection Time   09/15/11  3:08 PM      Component Value Range Comment   Lipase >3000 (*) 11 - 59 (U/L)   URINALYSIS, ROUTINE W REFLEX MICROSCOPIC     Status: Normal   Collection Time   09/15/11  6:02 PM      Component Value Range Comment   Color, Urine YELLOW  YELLOW     APPearance CLEAR  CLEAR     Specific Gravity, Urine 1.007  1.005 - 1.030     pH 6.5  5.0 - 8.0     Glucose, UA NEGATIVE  NEGATIVE (mg/dL)    Hgb urine dipstick NEGATIVE  NEGATIVE     Bilirubin Urine NEGATIVE  NEGATIVE     Ketones, ur NEGATIVE  NEGATIVE (mg/dL)    Protein, ur NEGATIVE  NEGATIVE (mg/dL)    Urobilinogen, UA 1.0  0.0 - 1.0 (mg/dL)    Nitrite NEGATIVE  NEGATIVE     Leukocytes, UA NEGATIVE  NEGATIVE  MICROSCOPIC NOT DONE ON URINES WITH NEGATIVE PROTEIN, BLOOD, LEUKOCYTES, NITRITE, OR GLUCOSE <1000 mg/dL.  PREGNANCY, URINE     Status: Normal   Collection Time   09/15/11  6:02 PM  Component Value Range Comment   Preg Test, Ur NEGATIVE      US Abdomen Complete  09/15/2011  *RADIOLOGY REPORT*  Clinical Data:  For quadrant pain with nausea and vomiting.  COMPLETE ABDOMINAL ULTRASOUND  Comparison:  Abdomen ultrasound, 09/11/2011  Findings:  Gallbladder:  Dependent partly shadowing gallstones.  The gallbladder wall measures 4 mm, mildly thickened.  There is no pericholecystic fluid.  Common bile duct:  Normal in caliber measuring 4 mm  Liver:  No focal lesion identified.  Within normal limits in parenchymal echogenicity.  IVC:  Appears normal.  Pancreas:  No focal abnormality seen.  Spleen:   Normal, measuring 9.2 cm in long axis  Right Kidney:  Normal, measuring 10.4 cm in long axis  Left Kidney:  Normal, measuring 13.6 cm in long axis  Abdominal aorta:  No aneurysm identified.  IMPRESSION: Gallstones with mild gallbladder wall thickening.  No pericholecystic fluid.  Findings are equivocal, but could reflect early acute cholecystitis in the proper clinical setting.  No other abnormalities.  Original Report Authenticated By:     Review of Systems  Constitutional: Negative.   HENT: Negative.   Eyes: Negative.   Respiratory: Negative.   Cardiovascular: Negative.   Gastrointestinal: Positive for nausea and vomiting.  Genitourinary: Negative.   Musculoskeletal: Negative.   Skin: Negative.   Neurological: Negative.   Endo/Heme/Allergies: Negative.   Psychiatric/Behavioral: Negative.     Blood pressure 120/79, pulse 79, temperature 97.5 F (36.4 C), temperature source Oral, resp. rate 20, weight 140 lb (63.504 kg), SpO2 98.00%, currently breastfeeding. Physical Exam  Constitutional: She is oriented to person, place, and time. She appears well-developed and well-nourished.  HENT:  Head: Normocephalic and atraumatic.  Eyes: Conjunctivae and EOM are normal. Pupils are equal, round, and reactive to light.  Neck: Normal range of motion. Neck supple.  Cardiovascular: Normal rate, regular rhythm and normal heart sounds.   Respiratory: Effort normal and breath sounds normal.  GI: Soft. There is tenderness.       Moderate upper abdominal pain without peritonitis  Musculoskeletal: Normal range of motion.  Neurological: She is alert and oriented to person, place, and time.  Skin: Skin is warm and dry.  Psychiatric: She has a normal mood and affect. Her behavior is normal.     Assessment/Plan 35 year old Hispanic female with what appears to be gallstone pancreatitis. We will plan to admit her for IV hydration and bowel rest. We will monitor her liver functions and pancreatic enzymes.  If they do not come down then she may need a GI consult and an ERCP. She would probably benefit from having her gallbladder removed during this hospitalization once her pancreatitis resolves.  TOTH III,PAUL S 09/15/2011, 7:09 PM

## 2011-09-15 NOTE — ED Notes (Signed)
Pt to ultrasound via stretcher

## 2011-09-15 NOTE — ED Notes (Signed)
Pt c/o left mid abd pain.  Skin warm and dry, color appropriate.  Husband at bedside.

## 2011-09-15 NOTE — ED Notes (Signed)
pT returned from ultrasound transported to CDU via stretcher with husband and child.  Report given to Marylu Lund, [place]

## 2011-09-15 NOTE — ED Notes (Signed)
Report given to Kim, RN.

## 2011-09-15 NOTE — ED Notes (Signed)
Pt reports abd pain, bloating, n/v, upper back pain, was here recently for same, no relief with meds given.

## 2011-09-15 NOTE — ED Notes (Signed)
[id number] READY

## 2011-09-15 NOTE — ED Provider Notes (Signed)
History     CSN: [id number]  Arrival date & time 09/15/11  1345   First MD Initiated Contact with Patient 09/15/11 1500      Chief Complaint  Patient presents with  . Abdominal Pain  . Back Pain  . Emesis    (Consider location/radiation/quality/duration/timing/severity/associated sxs/prior treatment) HPI Comments: Patient presents with right upper quadrant pain, nausea, vomiting and mid back pain. Pain is been going on since December 21. She was seen in this ED and had an ultrasound that showed gallbladder sludge. She reports that her pain is worsening or not controlled by medications at home. She denies any fevers, chest pain, shortness of breath. Pain is worsening with palpation and with food intake. He is 3 months postpartum.  The history is provided by the patient and a relative. The history is limited by a language barrier.    Past Medical History  Diagnosis Date  . Gestational diabetes     Past Surgical History  Procedure Date  . Wisdom tooth extraction 2011    Family History  Problem Relation Age of Onset  . Diabetes Mother     died from diabetes  . Heart disease Father     died from heart murmur  . Diabetes Brother     died from diabetes    History  Substance Use Topics  . Smoking status: Never Smoker   . Smokeless tobacco: Not on file  . Alcohol Use: No    OB History    Grav Para Term Preterm Abortions TAB SAB Ect Mult Living   1 1 1       1       Review of Systems  Constitutional: Negative for fever.  HENT: Negative for congestion and rhinorrhea.   Respiratory: Negative for cough and shortness of breath.   Cardiovascular: Positive for chest pain.  Gastrointestinal: Positive for nausea, vomiting and abdominal pain.  Genitourinary: Negative for dysuria and hematuria.  Musculoskeletal: Positive for back pain.  Skin: Negative for rash.    Allergies  Review of patient's allergies indicates no known allergies.  Home Medications   Current  Outpatient Rx  Name Route Sig Dispense Refill  . HYDROCODONE-ACETAMINOPHEN 5-500 MG PO TABS Oral Take 1-2 tablets by mouth every 6 (six) hours as needed for pain. 20 tablet 0  . IBUPROFEN 600 MG PO TABS Oral Take 1 tablet (600 mg total) by mouth every 6 (six) hours as needed for pain. 30 tablet 0  . ONDANSETRON 8 MG PO TBDP Oral Take 1 tablet (8 mg total) by mouth every 8 (eight) hours as needed for nausea. 20 tablet 0  . PRENATAL VITAMINS PO Oral Take 1 tablet by mouth daily.        BP 120/79  Pulse 79  Temp(Src) 97.5 F (36.4 C) (Oral)  Resp 20  Wt 140 lb (63.504 kg)  SpO2 98%  Breastfeeding? Yes  Physical Exam  Constitutional: She is oriented to person, place, and time. She appears well-developed and well-nourished. No distress.  HENT:  Head: Normocephalic and atraumatic.  Mouth/Throat: Oropharynx is clear and moist.  Eyes: Conjunctivae are normal. Pupils are equal, round, and reactive to light.  Neck: Normal range of motion.  Cardiovascular: Normal rate, regular rhythm and normal heart sounds.   Pulmonary/Chest: Effort normal and breath sounds normal. No respiratory distress.  Abdominal: Soft. There is tenderness. There is guarding.       Right upper quadrant pain with voluntary guarding, positive Murphy sign  Musculoskeletal: She exhibits tenderness.  Right paraspinal muscle pain  Neurological: She is alert and oriented to person, place, and time. No cranial nerve deficit.  Skin: Skin is warm.    ED Course  Procedures (including critical care time)  Labs Reviewed  CBC - Abnormal; Notable for the following:    WBC 17.4 (*)    RBC 5.38 (*)    MCV 73.8 (*)    MCH 24.3 (*)    RDW 17.0 (*)    All other components within normal limits  DIFFERENTIAL - Abnormal; Notable for the following:    Neutrophils Relative 94 (*)    Neutro Abs 16.3 (*)    Lymphocytes Relative 4 (*)    Monocytes Relative 2 (*)    All other components within normal limits  COMPREHENSIVE  METABOLIC PANEL - Abnormal; Notable for the following:    Glucose, Bld 148 (*)    Creatinine, Ser 0.45 (*)    AST 1141 (*)    ALT 887 (*)    Alkaline Phosphatase 151 (*)    Total Bilirubin 1.4 (*)    All other components within normal limits  LIPASE, BLOOD - Abnormal; Notable for the following:    Lipase >3000 (*)    All other components within normal limits  URINALYSIS, ROUTINE W REFLEX MICROSCOPIC  PREGNANCY, URINE   US Abdomen Complete  09/15/2011  *RADIOLOGY REPORT*  Clinical Data:  For quadrant pain with nausea and vomiting.  COMPLETE ABDOMINAL ULTRASOUND  Comparison:  Abdomen ultrasound, 09/11/2011  Findings:  Gallbladder:  Dependent partly shadowing gallstones.  The gallbladder wall measures 4 mm, mildly thickened.  There is no pericholecystic fluid.  Common bile duct:  Normal in caliber measuring 4 mm  Liver:  No focal lesion identified.  Within normal limits in parenchymal echogenicity.  IVC:  Appears normal.  Pancreas:  No focal abnormality seen.  Spleen:  Normal, measuring 9.2 cm in long axis  Right Kidney:  Normal, measuring 10.4 cm in long axis  Left Kidney:  Normal, measuring 13.6 cm in long axis  Abdominal aorta:  No aneurysm identified.  IMPRESSION: Gallstones with mild gallbladder wall thickening.  No pericholecystic fluid.  Findings are equivocal, but could reflect early acute cholecystitis in the proper clinical setting.  No other abnormalities.  Original Report Authenticated By:      1. Cholelithiasis   2. Elevated LFTs   3. Gallstone pancreatitis       MDM  Right upper quadrant pain, nausea, vomiting with history of biliary sludge. Pain is worsening and patient has continued to vomit. He does have significant pain on exam today. Will obtain labs, urinalysis, treat symptoms and likely repeat ultrasound.  Market elevation of LFTs and lipase on labwork. Mild gallbladder wall thickening.  Concern for common bile duct obstruction, gallstone pancreatitis and possible  cholecystitis. Discussed with Dr. Carolynne Edouard who will come see patient.      Glynn Octave, MD 09/15/11 [phone]

## 2011-09-16 LAB — COMPREHENSIVE METABOLIC PANEL WITH GFR
ALT: 791 U/L — ABNORMAL HIGH (ref 0–35)
AST: 565 U/L — ABNORMAL HIGH (ref 0–37)
Albumin: 3.3 g/dL — ABNORMAL LOW (ref 3.5–5.2)
Alkaline Phosphatase: 184 U/L — ABNORMAL HIGH (ref 39–117)
BUN: 6 mg/dL (ref 6–23)
CO2: 24 meq/L (ref 19–32)
Calcium: 8.6 mg/dL (ref 8.4–10.5)
Chloride: 107 meq/L (ref 96–112)
Creatinine, Ser: 0.4 mg/dL — ABNORMAL LOW (ref 0.50–1.10)
GFR calc Af Amer: >90 mL/min
GFR calc non Af Amer: >90 mL/min
Glucose, Bld: 105 mg/dL — ABNORMAL HIGH (ref 70–99)
Potassium: 3.5 meq/L (ref 3.5–5.1)
Sodium: 139 meq/L (ref 135–145)
Total Bilirubin: 0.8 mg/dL (ref 0.3–1.2)
Total Protein: 6.5 g/dL (ref 6.0–8.3)

## 2011-09-16 LAB — CBC
MCH: 24.4 pg — ABNORMAL LOW (ref 26.0–34.0)
Platelets: 244 10*3/uL (ref 150–400)
RBC: 5.05 MIL/uL (ref 3.87–5.11)
WBC: 13.9 10*3/uL — ABNORMAL HIGH (ref 4.0–10.5)

## 2011-09-16 LAB — AMYLASE: Amylase: 1058 U/L — ABNORMAL HIGH (ref 0–105)

## 2011-09-16 MED ORDER — INFLUENZA VIRUS VACC SPLIT PF IM SUSP
0.5000 mL | INTRAMUSCULAR | Status: AC
  Administered 2011-09-16: 0.5 mL via INTRAMUSCULAR
  Filled 2011-09-16: qty 0.5

## 2011-09-16 MED ORDER — BIOTENE DRY MOUTH MT LIQD
15.0000 mL | Freq: Two times a day (BID) | OROMUCOSAL | Status: DC
  Administered 2011-09-17: 15 mL via OROMUCOSAL

## 2011-09-16 MED ORDER — CHLORHEXIDINE GLUCONATE 0.12 % MT SOLN
15.0000 mL | Freq: Two times a day (BID) | OROMUCOSAL | Status: DC
  Administered 2011-09-16 – 2011-09-17 (×4): 15 mL via OROMUCOSAL
  Filled 2011-09-16 (×3): qty 15

## 2011-09-16 NOTE — Progress Notes (Signed)
Subjective: Still having some pain, but better.  No one speaks English so I am using google translate to try and communicate to Pt. And her husband.  ON Cipro and Flagyl.  Objective: Vital signs in last 24 hours: Temp:  [97.5 F (36.4 C)-98.8 F (37.1 C)] 98.3 F (36.8 C) (12/26 1039) Pulse Rate:  [75-83] 77  (12/26 1039) Resp:  [17-20] 17  (12/26 1039) BP: (108-123)/(67-79) 108/67 mmHg (12/26 1039) SpO2:  [96 %-100 %] 97 % (12/26 1039) FiO2 (%):  [21 %] 21 % (12/25 2125) Weight:  [63.504 kg (140 lb)] 140 lb (63.504 kg) (12/25 1906) Last BM Date: 09/14/11  Intake/Output from previous day: 12/25 0701 - 12/26 0700 In: -  Out: 1000 [Urine:1000] Intake/Output this shift:    General appearance: alert, cooperative and no distress GI: soft, still tender, no distension.  NPO  Lab Results:   Basename 09/16/11 0600 09/15/11 1508  WBC 13.9* 17.4*  HGB 12.3 13.1  HCT 37.6 39.7  PLT 244 245  lipase 1757 today >3000 yesterday   Lab 09/16/11 0600 09/15/11 1508 09/11/11 0636  AST 565* 1141* 21  ALT 791* 887* 29  ALKPHOS 184* 151* 82  BILITOT 0.8 1.4* 0.1*  PROT 6.5 7.3 6.8  ALBUMIN 3.3* 3.8 3.6     BMET  Basename 09/16/11 0600 09/15/11 1508  NA 139 135  K 3.5 4.0  CL 107 99  CO2 24 26  GLUCOSE 105* 148*  BUN 6 15  CREATININE 0.40* 0.45*  CALCIUM 8.6 9.1   PT/INR No results found for this basename: LABPROT:2,INR:2 in the last 72 hours   Studies/Results: US Abdomen Complete  09/15/2011  *RADIOLOGY REPORT*  Clinical Data:  For quadrant pain with nausea and vomiting.  COMPLETE ABDOMINAL ULTRASOUND  Comparison:  Abdomen ultrasound, 09/11/2011  Findings:  Gallbladder:  Dependent partly shadowing gallstones.  The gallbladder wall measures 4 mm, mildly thickened.  There is no pericholecystic fluid.  Common bile duct:  Normal in caliber measuring 4 mm  Liver:  No focal lesion identified.  Within normal limits in parenchymal echogenicity.  IVC:  Appears normal.  Pancreas:   No focal abnormality seen.  Spleen:  Normal, measuring 9.2 cm in long axis  Right Kidney:  Normal, measuring 10.4 cm in long axis  Left Kidney:  Normal, measuring 13.6 cm in long axis  Abdominal aorta:  No aneurysm identified.  IMPRESSION: Gallstones with mild gallbladder wall thickening.  No pericholecystic fluid.  Findings are equivocal, but could reflect early acute cholecystitis in the proper clinical setting.  No other abnormalities.  Original Report Authenticated By:     Anti-infectives: Anti-infectives     Start     Dose/Rate Route Frequency Ordered Stop   09/15/11 2100   metroNIDAZOLE (FLAGYL) IVPB 500 mg        500 mg 100 mL/hr over 60 Minutes Intravenous Every 8 hours 09/15/11 1907     09/15/11 1915   ciprofloxacin (CIPRO) IVPB 400 mg        400 mg 200 mL/hr over 60 Minutes Intravenous Every 12 hours 09/15/11 1907           Current Facility-Administered Medications  Medication Dose Route Frequency Provider Last Rate Last Dose  . antiseptic oral rinse (BIOTENE) solution 15 mL  15 mL Mouth Rinse q12n4p Caleen Essex III, MD      . chlorhexidine (PERIDEX) 0.12 % solution 15 mL  15 mL Mouth Rinse BID Caleen Essex III, MD   15 mL  at 09/16/11 0800  . ciprofloxacin (CIPRO) IVPB 400 mg  400 mg Intravenous Q12H Caleen Essex III, MD   400 mg at 09/15/11 2119  . dextrose 5 % and 0.9 % NaCl with KCl 20 mEq/L infusion   Intravenous Continuous Caleen Essex III, MD 100 mL/hr at 09/15/11 2118    . HYDROmorphone (DILAUDID) injection 1 mg  1 mg Intravenous Once Glynn Octave, MD   1 mg at 09/15/11 1523  . influenza  inactive virus vaccine (FLUZONE/FLUARIX) injection 0.5 mL  0.5 mL Intramuscular Tomorrow-1000 Caleen Essex III, MD      . metroNIDAZOLE (FLAGYL) IVPB 500 mg  500 mg Intravenous Q8H Caleen Essex III, MD   500 mg at 09/16/11 0509  . morphine 2 MG/ML injection 4 mg  4 mg Intravenous Q1H PRN Caleen Essex III, MD      . ondansetron [facility]) injection 4 mg  4 mg Intravenous Once Glynn Octave,  MD   4 mg at 09/15/11 1522  . ondansetron (ZOFRAN) injection 4 mg  4 mg Intravenous Q6H PRN Caleen Essex III, MD      . pantoprazole (PROTONIX) injection 40 mg  40 mg Intravenous QHS Caleen Essex III, MD   40 mg at 09/15/11 2256  . sodium chloride 0.9 % bolus 1,000 mL  1,000 mL Intravenous Once Glynn Octave, MD   1,000 mL at 09/15/11 1525    Assessment/Plan Gallstone Pancreatitis  post partum Gestational diabetes Plan: Clear liquids, start with some sips, otherwise NPO, continue antibiotics, & IV fluids.  Recheck in AM.  When pancreatitis is better plan cholecystectomy.  LOS: 1 day    Emma Beard 09/16/2011

## 2011-09-16 NOTE — Progress Notes (Signed)
WBC better, but still symptomatic.  Probably will go to O.R. Friday.  Marta Lamas. Gae Bon, MD, FACS [phone] [facility]

## 2011-09-16 NOTE — Progress Notes (Signed)
Rn has educated patient on her baby sleeping in bed with her and the risk factors and the recommendation of not co-sleeping and reasons why she should not. Rn got a bassinet from 6100 so that the baby could sleep in it and the baby is still sleeping in the bed with the patient. Rn will continue to try and encourage mom to have baby sleep in bassinet.

## 2011-09-17 LAB — CBC
MCV: 75.7 fL — ABNORMAL LOW (ref 78.0–100.0)
Platelets: 233 10*3/uL (ref 150–400)
RBC: 5.02 MIL/uL (ref 3.87–5.11)
RDW: 17.5 % — ABNORMAL HIGH (ref 11.5–15.5)
WBC: 14.5 10*3/uL — ABNORMAL HIGH (ref 4.0–10.5)

## 2011-09-17 LAB — COMPREHENSIVE METABOLIC PANEL
ALT: 472 U/L — ABNORMAL HIGH (ref 0–35)
AST: 149 U/L — ABNORMAL HIGH (ref 0–37)
Albumin: 3.2 g/dL — ABNORMAL LOW (ref 3.5–5.2)
CO2: 24 mEq/L (ref 19–32)
Calcium: 8.9 mg/dL (ref 8.4–10.5)
Chloride: 107 mEq/L (ref 96–112)
Creatinine, Ser: 0.4 mg/dL — ABNORMAL LOW (ref 0.50–1.10)
Sodium: 142 mEq/L (ref 135–145)
Total Bilirubin: 0.6 mg/dL (ref 0.3–1.2)

## 2011-09-17 NOTE — Progress Notes (Signed)
Subjective: Still having some pain, but better.  No one speaks English so I am using google translate to try and communicate to Pt. And her husband.  Slow improvement, she points to her abdomen below xyphoid as place where she has pain.  Continue current RX.  Chemistries still pending.  Objective: Vital signs in last 24 hours: Temp:  [98.3 F (36.8 C)-99.6 F (37.6 C)] 99.1 F (37.3 C) (12/27 0615) Pulse Rate:  [77-105] 91  (12/27 0615) Resp:  [15-19] 17  (12/27 0615) BP: (93-138)/(59-81) 93/59 mmHg (12/27 0615) SpO2:  [94 %-100 %] 94 % (12/27 0615) Last BM Date: 09/14/11  Intake/Output from previous day: 12/26 0701 - 12/27 0700 In: 2461.9 [I.V.:2461.9] Out: 225 [Urine:225] Intake/Output this shift:    General appearance: alert, cooperative and no distress GI: soft, still tender, no distension.  NPO  Lab Results:   Basename 09/17/11 0550 09/16/11 0600  WBC 14.5* 13.9*  HGB 12.4 12.3  HCT 38.0 37.6  PLT 233 244  lipase 1757 today >3000 yesterday   Lab 09/16/11 0600 09/15/11 1508 09/11/11 0636  AST 565* 1141* 21  ALT 791* 887* 29  ALKPHOS 184* 151* 82  BILITOT 0.8 1.4* 0.1*  PROT 6.5 7.3 6.8  ALBUMIN 3.3* 3.8 3.6     BMET  Basename 09/16/11 0600 09/15/11 1508  NA 139 135  K 3.5 4.0  CL 107 99  CO2 24 26  GLUCOSE 105* 148*  BUN 6 15  CREATININE 0.40* 0.45*  CALCIUM 8.6 9.1   PT/INR No results found for this basename: LABPROT:2,INR:2 in the last 72 hours   Studies/Results: US Abdomen Complete  09/15/2011  *RADIOLOGY REPORT*  Clinical Data:  For quadrant pain with nausea and vomiting.  COMPLETE ABDOMINAL ULTRASOUND  Comparison:  Abdomen ultrasound, 09/11/2011  Findings:  Gallbladder:  Dependent partly shadowing gallstones.  The gallbladder wall measures 4 mm, mildly thickened.  There is no pericholecystic fluid.  Common bile duct:  Normal in caliber measuring 4 mm  Liver:  No focal lesion identified.  Within normal limits in parenchymal echogenicity.   IVC:  Appears normal.  Pancreas:  No focal abnormality seen.  Spleen:  Normal, measuring 9.2 cm in long axis  Right Kidney:  Normal, measuring 10.4 cm in long axis  Left Kidney:  Normal, measuring 13.6 cm in long axis  Abdominal aorta:  No aneurysm identified.  IMPRESSION: Gallstones with mild gallbladder wall thickening.  No pericholecystic fluid.  Findings are equivocal, but could reflect early acute cholecystitis in the proper clinical setting.  No other abnormalities.  Original Report Authenticated By:     Anti-infectives: Anti-infectives     Start     Dose/Rate Route Frequency Ordered Stop   09/15/11 2100   metroNIDAZOLE (FLAGYL) IVPB 500 mg        500 mg 100 mL/hr over 60 Minutes Intravenous Every 8 hours 09/15/11 1907     09/15/11 1915   ciprofloxacin (CIPRO) IVPB 400 mg        400 mg 200 mL/hr over 60 Minutes Intravenous Every 12 hours 09/15/11 1907           Current Facility-Administered Medications  Medication Dose Route Frequency Provider Last Rate Last Dose  . antiseptic oral rinse (BIOTENE) solution 15 mL  15 mL Mouth Rinse q12n4p Caleen Essex III, MD      . chlorhexidine (PERIDEX) 0.12 % solution 15 mL  15 mL Mouth Rinse BID Caleen Essex III, MD   15 mL at 09/16/11  2205  . ciprofloxacin (CIPRO) IVPB 400 mg  400 mg Intravenous Q12H Caleen Essex III, MD   400 mg at 09/16/11 2304  . dextrose 5 % and 0.9 % NaCl with KCl 20 mEq/L infusion   Intravenous Continuous Caleen Essex III, MD 100 mL/hr at 09/16/11 2054    . influenza  inactive virus vaccine (FLUZONE/FLUARIX) injection 0.5 mL  0.5 mL Intramuscular Tomorrow-1000 Caleen Essex III, MD   0.5 mL at 09/16/11 1148  . metroNIDAZOLE (FLAGYL) IVPB 500 mg  500 mg Intravenous Q8H Caleen Essex III, MD   500 mg at 09/17/11 0515  . morphine 2 MG/ML injection 4 mg  4 mg Intravenous Q1H PRN Caleen Essex III, MD   4 mg at 09/16/11 2304  . ondansetron (ZOFRAN) injection 4 mg  4 mg Intravenous Q6H PRN Caleen Essex III, MD      . pantoprazole  (PROTONIX) injection 40 mg  40 mg Intravenous QHS Caleen Essex III, MD   40 mg at 09/16/11 2205    Assessment/Plan Gallstone Pancreatitis  post partum Gestational diabetes Plan: Clear liquids otherwise NPO, continue antibiotics, & IV fluids.  Recheck in AM.  When pancreatitis is better plan cholecystectomy.  LOS: 2 days    Geri Hepler 09/17/2011

## 2011-09-18 ENCOUNTER — Encounter (HOSPITAL_COMMUNITY): Admission: EM | Disposition: A | Discharge status: Home / Self Care | Payer: Self-pay | Source: Home / Self Care

## 2011-09-18 ENCOUNTER — Other Ambulatory Visit (INDEPENDENT_AMBULATORY_CARE_PROVIDER_SITE_OTHER): Payer: Self-pay | Admitting: General Surgery

## 2011-09-18 ENCOUNTER — Inpatient Hospital Stay (HOSPITAL_COMMUNITY): Payer: Medicaid Other

## 2011-09-18 ENCOUNTER — Encounter (HOSPITAL_COMMUNITY): Payer: Self-pay | Admitting: Anesthesiology

## 2011-09-18 ENCOUNTER — Inpatient Hospital Stay (HOSPITAL_COMMUNITY): Payer: Medicaid Other | Admitting: Anesthesiology

## 2011-09-18 DIAGNOSIS — K851 Biliary acute pancreatitis without necrosis or infection: Secondary | ICD-10-CM

## 2011-09-18 DIAGNOSIS — O24419 Gestational diabetes mellitus in pregnancy, unspecified control: Secondary | ICD-10-CM

## 2011-09-18 HISTORY — PX: CHOLECYSTECTOMY: SHX55

## 2011-09-18 LAB — COMPREHENSIVE METABOLIC PANEL
BUN: 6 mg/dL (ref 6–23)
CO2: 22 mEq/L (ref 19–32)
Calcium: 8.8 mg/dL (ref 8.4–10.5)
Chloride: 104 mEq/L (ref 96–112)
Creatinine, Ser: 0.47 mg/dL — ABNORMAL LOW (ref 0.50–1.10)
GFR calc Af Amer: >90 mL/min (ref >90)
GFR calc non Af Amer: >90 mL/min (ref >90)
Total Bilirubin: 0.8 mg/dL (ref 0.3–1.2)

## 2011-09-18 LAB — LIPASE, BLOOD: Lipase: 139 U/L — ABNORMAL HIGH (ref 11–59)

## 2011-09-18 LAB — CBC
HCT: 37.6 % (ref 36.0–46.0)
MCH: 24.6 pg — ABNORMAL LOW (ref 26.0–34.0)
MCV: 76 fL — ABNORMAL LOW (ref 78.0–100.0)
RBC: 4.95 MIL/uL (ref 3.87–5.11)
WBC: 14.7 10*3/uL — ABNORMAL HIGH (ref 4.0–10.5)

## 2011-09-18 SURGERY — LAPAROSCOPIC CHOLECYSTECTOMY WITH INTRAOPERATIVE CHOLANGIOGRAM
Anesthesia: General | Site: Abdomen | Wound class: Clean Contaminated

## 2011-09-18 MED ORDER — ONDANSETRON HCL 4 MG/2ML IJ SOLN
INTRAMUSCULAR | Status: DC | PRN
  Administered 2011-09-18: 4 mg via INTRAVENOUS

## 2011-09-18 MED ORDER — BUPIVACAINE HCL (PF) 0.25 % IJ SOLN
INTRAMUSCULAR | Status: DC | PRN
  Administered 2011-09-18: 27 mL

## 2011-09-18 MED ORDER — HYDROCODONE-ACETAMINOPHEN 5-325 MG PO TABS: 1.0000 | ORAL_TABLET | ORAL | Status: DC | PRN

## 2011-09-18 MED ORDER — MEPERIDINE HCL 25 MG/ML IJ SOLN: 6.2500 mg | INTRAMUSCULAR | Status: DC | PRN

## 2011-09-18 MED ORDER — SODIUM CHLORIDE 0.9 % IR SOLN
Status: DC | PRN
  Administered 2011-09-18: 1000 mL

## 2011-09-18 MED ORDER — MIDAZOLAM HCL 5 MG/5ML IJ SOLN
INTRAMUSCULAR | Status: DC | PRN
  Administered 2011-09-18: 2 mg via INTRAVENOUS

## 2011-09-18 MED ORDER — DEXAMETHASONE SODIUM PHOSPHATE 4 MG/ML IJ SOLN
INTRAMUSCULAR | Status: DC | PRN
  Administered 2011-09-18: 4 mg via INTRAVENOUS

## 2011-09-18 MED ORDER — IOHEXOL 300 MG/ML  SOLN
INTRAMUSCULAR | Status: DC | PRN
  Administered 2011-09-18: 7 mL

## 2011-09-18 MED ORDER — ONDANSETRON HCL 4 MG/2ML IJ SOLN: 4.0000 mg | Freq: Once | INTRAMUSCULAR | Status: AC | PRN

## 2011-09-18 MED ORDER — FENTANYL CITRATE 0.05 MG/ML IJ SOLN
INTRAMUSCULAR | Status: DC | PRN
  Administered 2011-09-18: 50 ug via INTRAVENOUS
  Administered 2011-09-18: 150 ug via INTRAVENOUS
  Administered 2011-09-18: 50 ug via INTRAVENOUS

## 2011-09-18 MED ORDER — LACTATED RINGERS IV SOLN
INTRAVENOUS | Status: DC | PRN
  Administered 2011-09-18 (×2): via INTRAVENOUS

## 2011-09-18 MED ORDER — HYDROMORPHONE HCL PF 1 MG/ML IJ SOLN: 0.2500 mg | INTRAMUSCULAR | Status: DC | PRN

## 2011-09-18 MED ORDER — NEOSTIGMINE METHYLSULFATE 1 MG/ML IJ SOLN
INTRAMUSCULAR | Status: DC | PRN
  Administered 2011-09-18: 4 mg via INTRAVENOUS

## 2011-09-18 MED ORDER — PRENATAL VITAMINS (DIS) PO TABS: 1.0000 | ORAL_TABLET | Freq: Every day | ORAL | Status: DC

## 2011-09-18 MED ORDER — PROPOFOL 10 MG/ML IV EMUL
INTRAVENOUS | Status: DC | PRN
  Administered 2011-09-18: 180 mg via INTRAVENOUS

## 2011-09-18 MED ORDER — ONDANSETRON 8 MG PO TBDP
8.0000 mg | ORAL_TABLET | Freq: Three times a day (TID) | ORAL | Status: DC | PRN
  Filled 2011-09-18: qty 1

## 2011-09-18 MED ORDER — ENOXAPARIN SODIUM 40 MG/0.4ML ~~LOC~~ SOLN
40.0000 mg | Freq: Every day | SUBCUTANEOUS | Status: DC
  Administered 2011-09-18: 40 mg via SUBCUTANEOUS
  Filled 2011-09-18 (×2): qty 0.4

## 2011-09-18 MED ORDER — PRENATAL MULTIVITAMIN CH
1.0000 | ORAL_TABLET | Freq: Every day | ORAL | Status: DC
  Administered 2011-09-19: 1 via ORAL
  Filled 2011-09-18 (×2): qty 1

## 2011-09-18 MED ORDER — ONDANSETRON HCL 4 MG/2ML IJ SOLN: 4.0000 mg | Freq: Four times a day (QID) | INTRAMUSCULAR | Status: DC | PRN

## 2011-09-18 MED ORDER — 0.9 % SODIUM CHLORIDE (POUR BTL) OPTIME
TOPICAL | Status: DC | PRN
  Administered 2011-09-18: 1000 mL

## 2011-09-18 MED ORDER — HYDROMORPHONE HCL PF 1 MG/ML IJ SOLN
0.2500 mg | INTRAMUSCULAR | Status: DC | PRN
  Administered 2011-09-18: 0.5 mg via INTRAVENOUS

## 2011-09-18 MED ORDER — ROCURONIUM BROMIDE 100 MG/10ML IV SOLN
INTRAVENOUS | Status: DC | PRN
  Administered 2011-09-18: 50 mg via INTRAVENOUS

## 2011-09-18 MED ORDER — HYDROCODONE-ACETAMINOPHEN 5-325 MG PO TABS: 1.0000 | ORAL_TABLET | ORAL | Status: AC | PRN

## 2011-09-18 MED ORDER — LACTATED RINGERS IV SOLN
INTRAVENOUS | Status: DC
  Administered 2011-09-18: 12:00:00 via INTRAVENOUS

## 2011-09-18 MED ORDER — GLYCOPYRROLATE 0.2 MG/ML IJ SOLN
INTRAMUSCULAR | Status: DC | PRN
  Administered 2011-09-18: .6 mg via INTRAVENOUS

## 2011-09-18 MED ORDER — ONDANSETRON HCL 4 MG PO TABS: 4.0000 mg | ORAL_TABLET | Freq: Four times a day (QID) | ORAL | Status: DC | PRN

## 2011-09-18 SURGICAL SUPPLY — 48 items
APPLIER CLIP 5 13 M/L LIGAMAX5 (MISCELLANEOUS) ×2
APPLIER CLIP ROT 10 11.4 M/L (STAPLE)
BLADE SURG ROTATE 9660 (MISCELLANEOUS) ×2 IMPLANT
CANISTER SUCTION 2500CC (MISCELLANEOUS) ×2 IMPLANT
CHLORAPREP W/TINT 26ML (MISCELLANEOUS) ×2 IMPLANT
CLIP APPLIE 5 13 M/L LIGAMAX5 (MISCELLANEOUS) ×1 IMPLANT
CLIP APPLIE ROT 10 11.4 M/L (STAPLE) IMPLANT
CLOTH BEACON ORANGE TIMEOUT ST (SAFETY) ×2 IMPLANT
COVER MAYO STAND STRL (DRAPES) ×2 IMPLANT
COVER SURGICAL LIGHT HANDLE (MISCELLANEOUS) ×2 IMPLANT
DECANTER SPIKE VIAL GLASS SM (MISCELLANEOUS) ×4 IMPLANT
DERMABOND ADVANCED (GAUZE/BANDAGES/DRESSINGS)
DERMABOND ADVANCED .7 DNX12 (GAUZE/BANDAGES/DRESSINGS) IMPLANT
DRAPE C-ARM 42X72 X-RAY (DRAPES) ×2 IMPLANT
DRAPE UTILITY 15X26 W/TAPE STR (DRAPE) ×4 IMPLANT
ELECT REM PT RETURN 9FT ADLT (ELECTROSURGICAL) ×2
ELECTRODE REM PT RTRN 9FT ADLT (ELECTROSURGICAL) ×1 IMPLANT
GLOVE BIO SURGEON STRL SZ7.5 (GLOVE) ×2 IMPLANT
GLOVE BIOGEL PI IND STRL 6.5 (GLOVE) ×1 IMPLANT
GLOVE BIOGEL PI IND STRL 7.0 (GLOVE) ×1 IMPLANT
GLOVE BIOGEL PI IND STRL 7.5 (GLOVE) ×1 IMPLANT
GLOVE BIOGEL PI IND STRL 8 (GLOVE) ×1 IMPLANT
GLOVE BIOGEL PI INDICATOR 6.5 (GLOVE) ×1
GLOVE BIOGEL PI INDICATOR 7.0 (GLOVE) ×1
GLOVE BIOGEL PI INDICATOR 7.5 (GLOVE) ×1
GLOVE BIOGEL PI INDICATOR 8 (GLOVE) ×1
GLOVE ECLIPSE 7.0 STRL STRAW (GLOVE) ×2 IMPLANT
GLOVE ECLIPSE 7.5 STRL STRAW (GLOVE) ×2 IMPLANT
GLOVE SURG SS PI 6.5 STRL IVOR (GLOVE) ×4 IMPLANT
GOWN STRL NON-REIN LRG LVL3 (GOWN DISPOSABLE) ×8 IMPLANT
KIT BASIN OR (CUSTOM PROCEDURE TRAY) ×2 IMPLANT
KIT ROOM TURNOVER OR (KITS) ×2 IMPLANT
NS IRRIG 1000ML POUR BTL (IV SOLUTION) ×2 IMPLANT
PAD ARMBOARD 7.5X6 YLW CONV (MISCELLANEOUS) ×4 IMPLANT
POUCH SPECIMEN RETRIEVAL 10MM (ENDOMECHANICALS) IMPLANT
SCISSORS LAP 5X35 DISP (ENDOMECHANICALS) IMPLANT
SET CHOLANGIOGRAPH 5 50 .035 (SET/KITS/TRAYS/PACK) ×2 IMPLANT
SET IRRIG TUBING LAPAROSCOPIC (IRRIGATION / IRRIGATOR) ×2 IMPLANT
SLEEVE ENDOPATH XCEL 5M (ENDOMECHANICALS) ×4 IMPLANT
SPECIMEN JAR SMALL (MISCELLANEOUS) ×2 IMPLANT
SUT MNCRL AB 4-0 PS2 18 (SUTURE) ×2 IMPLANT
TOWEL OR 17X24 6PK STRL BLUE (TOWEL DISPOSABLE) ×2 IMPLANT
TOWEL OR 17X26 10 PK STRL BLUE (TOWEL DISPOSABLE) ×2 IMPLANT
TRAY LAPAROSCOPIC (CUSTOM PROCEDURE TRAY) ×2 IMPLANT
TROCAR XCEL BLUNT TIP 100MML (ENDOMECHANICALS) ×2 IMPLANT
TROCAR XCEL NON-BLD 11X100MML (ENDOMECHANICALS) IMPLANT
TROCAR XCEL NON-BLD 5MMX100MML (ENDOMECHANICALS) ×2 IMPLANT
WATER STERILE IRR 1000ML POUR (IV SOLUTION) IMPLANT

## 2011-09-18 NOTE — Progress Notes (Signed)
   CARE MANAGEMENT NOTE 09/18/2011  Patient:  Emma Beard,Emma Beard   Account Number:  [id number]  Date Initiated:  09/18/2011  Documentation initiated by:  Carlyle Lipa  Subjective/Objective Assessment:   pancreatitis; await improvement in that to have lap chole--to OR 12/28 if labs ok     Action/Plan:   Home with husband when stable postop   Anticipated DC Date:  09/21/2011   Anticipated DC Plan:  HOME/SELF CARE      DC Planning Services  CM consult                Status of service:  In process, will continue to follow  Per UR Regulation:  Reviewed for med. necessity/level of care/duration of stay

## 2011-09-18 NOTE — Progress Notes (Signed)
Emma Beard, III, MD, FACS [phone]--pager [phone]--office [facility]  

## 2011-09-18 NOTE — Anesthesia Procedure Notes (Addendum)
Procedure Name: Intubation Date/Time: 09/18/2011 1:31 PM Performed by: Elon Alas Pre-anesthesia Checklist: Patient identified, Timeout performed, Emergency Drugs available, Suction available and Patient being monitored Patient Re-evaluated:Patient Re-evaluated prior to inductionOxygen Delivery Method: Circle System Utilized Preoxygenation: Pre-oxygenation with 100% oxygen Intubation Type: IV induction and Cricoid Pressure applied Ventilation: Mask ventilation without difficulty and Oral airway inserted - appropriate to patient size Laryngoscope Size: Mac and 3 Grade View: Grade I Tube type: Oral Tube size: 7.0 mm Number of attempts: 1 Airway Equipment and Method: stylet Placement Confirmation: ETT inserted through vocal cords under direct vision,  positive ETCO2 and breath sounds checked- equal and bilateral Secured at: 21 cm Tube secured with: Tape Dental Injury: Teeth and Oropharynx as per pre-operative assessment

## 2011-09-18 NOTE — Anesthesia Postprocedure Evaluation (Signed)
  Anesthesia Post-op Note  Patient: Emma Beard  Procedure(s) Performed:  LAPAROSCOPIC CHOLECYSTECTOMY WITH INTRAOPERATIVE CHOLANGIOGRAM  Patient Location: PACU  Anesthesia Type: General  Level of Consciousness: awake, alert  and oriented  Airway and Oxygen Therapy: Patient Spontanous Breathing and Patient connected to nasal cannula oxygen  Post-op Pain: mild  Post-op Assessment: Post-op Vital signs reviewed and Patient's Cardiovascular Status Stable  Post-op Vital Signs: stable  Complications: No apparent anesthesia complications

## 2011-09-18 NOTE — Progress Notes (Signed)
Pt interviewed in holding, all checklist data confirmed including NPO status.

## 2011-09-18 NOTE — Progress Notes (Signed)
Emma Beard, III, MD, FACS [phone]--pager [phone]--office [facility]  

## 2011-09-18 NOTE — Op Note (Signed)
OPERATIVE REPORT  DATE OF OPERATION: 09/15/2011 - 09/18/2011  PATIENT:  Emma Beard  35 y.o. female  PRE-OPERATIVE DIAGNOSIS:  Gallstone Pancreatitis  POST-OPERATIVE DIAGNOSIS:  Gallstone Pancreatitis  PROCEDURE:  Procedure(s): LAPAROSCOPIC CHOLECYSTECTOMY WITH INTRAOPERATIVE CHOLANGIOGRAM  SURGEON:  Surgeon(s): Jetty Duhamel, MD  ASSISTANTOrson Slick, RNFA  ANESTHESIA:   general  EBL: 20 ml  BLOOD ADMINISTERED: none  DRAINS: none   SPECIMEN:  Source of Specimen:  gallbladder  COUNTS CORRECT:  YES  PROCEDURE DETAILS: The patient was taken to the operating room and placed on the table in the supine position. After an adequate endotracheal anesthetic was administered a proper time out was performed identifying the patient and the procedure to be performed and she was prepped and draped in usual sterile manner exposing the entire abdomen.  An infraumbilical midline incision was made using a 15 blade and taken down to the midline fascia. We incised the midline fascia using 15 blade and grabbed the edges were called to commands. We then bluntly dissected down into the peritoneal cavity. Once this was done a pursestring suture of 0 Vicryl was passed around the fascial opening. A Hassan cannula was subsequently passed into the peritoneal cavity and secured in place with a pursestring suture. We then insufflated carbon dioxide gas into the peritoneal cavity up to a maximal pressure of 15 mm mercury.  Once this was done to right costal margin 5 mm cannulas in the subxiphoid 11 mm cannula passed under direct vision. The patient was placed in reverse Trendelenburg less of was tilted down.  We grabbed the gallbladder and retracted it toward the intra-abdominal wall in the right upper quadrant. We dissected out the peritoneum overlying the tract of the flow and the hepatoduodenal triangle. We clipped on the gallbladder side of the cystic duct the medical cystolithotomy to which  catheter was passed don't perform cholangiogram. This showed good flow into the duodenum or proximal filling no intraductal filling defects. The cholecystokinin he was distal and we had to place clips along the assistant at common duct junction very carefully after removing the cannula. The cystic artery was isolated and cut proximally and distally and transected. We then dissected out the gallbladder from its bed with minimal difficulty.  The gallbladder was retrieved from infraumbilical site using a grasper. Because of the fascia at the infraumbilical site using a procedure to which was in place. Quarter percent Marcaine with epi was injected in all sites. We aspirated all fluid and gas from above liver prior to removing the cannulas. All once all cannulas were removed the infraumbilical and subxiphoid skin sites were closed using running subcuticular stitch of 4-0 Monocryl. Dermabond Steri-Strips and Tegaderms used to complete the dressing was all needle counts sponge counts and instrument counts were correct.  PATIENT DISPOSITION:  PACU - hemodynamically stable.   [name],Corian Handley O 12/28/20122:37 PM

## 2011-09-18 NOTE — Transfer of Care (Signed)
Immediate Anesthesia Transfer of Care Note  Patient: Emma Beard  Procedure(s) Performed:  LAPAROSCOPIC CHOLECYSTECTOMY WITH INTRAOPERATIVE CHOLANGIOGRAM  Patient Location: PACU  Anesthesia Type: General  Level of Consciousness: awake, alert  and oriented  Airway & Oxygen Therapy: Patient Spontanous Breathing and Patient connected to nasal cannula oxygen  Post-op Assessment: Report given to PACU RN, Post -op Vital signs reviewed and stable and Patient moving all extremities X 4  Post vital signs: Reviewed and stable  Complications: No apparent anesthesia complications

## 2011-09-18 NOTE — Anesthesia Preprocedure Evaluation (Addendum)
Anesthesia Evaluation  Patient identified by MRN, date of birth, ID band Patient awake    Reviewed: Allergy & Precautions, H&P , NPO status   Airway Mallampati: I TM Distance: >3 FB Neck ROM: Full    Dental  (+) Teeth Intact   Pulmonary  clear to auscultation        Cardiovascular Regular Normal    Neuro/Psych    GI/Hepatic   Endo/Other    Renal/GU      Musculoskeletal   Abdominal   Peds  Hematology   Anesthesia Other Findings   Reproductive/Obstetrics                           Anesthesia Physical Anesthesia Plan  ASA: II  Anesthesia Plan: General   Post-op Pain Management:    Induction: Intravenous  Airway Management Planned: Oral ETT  Additional Equipment:   Intra-op Plan:   Post-operative Plan: Extubation in OR  Informed Consent:   Dental advisory given  Plan Discussed with: CRNA and Surgeon  Anesthesia Plan Comments:         Anesthesia Quick Evaluation

## 2011-09-18 NOTE — Interval H&P Note (Signed)
History and Physical Interval Note:  09/18/2011 12:51 PM  Emma Beard  has presented today for surgery, with the diagnosis of Gallstone Pancreatitis  The various methods of treatment have been discussed with the patient and family. After consideration of risks, benefits and other options for treatment, the patient has consented to  Procedure(s): LAPAROSCOPIC CHOLECYSTECTOMY WITH INTRAOPERATIVE CHOLANGIOGRAM as a surgical intervention .  The patients' history has been reviewed, patient examined, no change in status, stable for surgery.  I have reviewed the patients' chart and labs.  Questions were answered to the patient's satisfaction.     Emma Beard,Emma Beard

## 2011-09-18 NOTE — Progress Notes (Signed)
Subjective: She denies any pain this AM.  Her blood has been drawn, but results are not up yet.  If her labs are OK plan surgery later today.  Again we are using Google translate for a majority of our conversations.  Her Husband and baby are also in room.. They said they have no questions.   Objective: Vital signs in last 24 hours: Temp:  [98.4 F (36.9 C)-98.8 F (37.1 C)] 98.8 F (37.1 C) (12/28 0512) Pulse Rate:  [89-98] 89  (12/28 0512) Resp:  [17-18] 18  (12/28 0512) BP: (94-117)/(64-70) 94/64 mmHg (12/28 0512) SpO2:  [97 %-98 %] 97 % (12/28 0512) Last BM Date: 09/14/11  Intake/Output from previous day: 12/27 0701 - 12/28 0700 In: 2404.8 [I.V.:2404.8] Out: 903 [Urine:903] Intake/Output this shift:    General appearance: alert, cooperative and no distress GI: soft, still tender, no distension.  NPO  Lab Results:   Basename 09/17/11 0550 09/16/11 0600  WBC 14.5* 13.9*  HGB 12.4 12.3  HCT 38.0 37.6  PLT 233 244  lipase 1757 today >3000 yesterday   Lab 09/17/11 0550 09/16/11 0600 09/15/11 1508  AST 149* 565* 1141*  ALT 472* 791* 887*  ALKPHOS 165* 184* 151*  BILITOT 0.6 0.8 1.4*  PROT 7.0 6.5 7.3  ALBUMIN 3.2* 3.3* 3.8     BMET  Basename 09/17/11 0550 09/16/11 0600  NA 142 139  K 3.5 3.5  CL 107 107  CO2 24 24  GLUCOSE 99 105*  BUN 6 6  CREATININE 0.40* 0.40*  CALCIUM 8.9 8.6   PT/INR No results found for this basename: LABPROT:2,INR:2 in the last 72 hours   Studies/Results: No results found.  Anti-infectives: Anti-infectives     Start     Dose/Rate Route Frequency Ordered Stop   09/15/11 2100   metroNIDAZOLE (FLAGYL) IVPB 500 mg  Status:  Discontinued        500 mg 100 mL/hr over 60 Minutes Intravenous Every 8 hours 09/15/11 1907 09/17/11 1413   09/15/11 1915   ciprofloxacin (CIPRO) IVPB 400 mg        400 mg 200 mL/hr over 60 Minutes Intravenous Every 12 hours 09/15/11 1907           Current Facility-Administered Medications    Medication Dose Route Frequency Provider Last Rate Last Dose  . antiseptic oral rinse (BIOTENE) solution 15 mL  15 mL Mouth Rinse q12n4p Caleen Essex III, MD   15 mL at 09/17/11 1600  . chlorhexidine (PERIDEX) 0.12 % solution 15 mL  15 mL Mouth Rinse BID Caleen Essex III, MD   15 mL at 09/17/11 2043  . ciprofloxacin (CIPRO) IVPB 400 mg  400 mg Intravenous Q12H Caleen Essex III, MD   400 mg at 09/17/11 2132  . dextrose 5 % and 0.9 % NaCl with KCl 20 mEq/L infusion   Intravenous Continuous Caleen Essex III, MD 100 mL/hr at 09/17/11 2127    . morphine 2 MG/ML injection 4 mg  4 mg Intravenous Q1H PRN Caleen Essex III, MD   4 mg at 09/16/11 2304  . ondansetron (ZOFRAN) injection 4 mg  4 mg Intravenous Q6H PRN Caleen Essex III, MD      . pantoprazole (PROTONIX) injection 40 mg  40 mg Intravenous QHS Caleen Essex III, MD   40 mg at 09/17/11 2128  . DISCONTD: metroNIDAZOLE (FLAGYL) IVPB 500 mg  500 mg Intravenous Q8H Caleen Essex III, MD   500 mg at  09/17/11 0515    Assessment/Plan Gallstone Pancreatitis  post partum Gestational diabetes Plan: If labs OK; Surgery later today.                                                                                                     LOS: 3 days    Emma Beard 09/18/2011

## 2011-09-18 NOTE — Preoperative (Signed)
Beta Blockers   Reason not to administer Beta Blockers:Not Applicable 

## 2011-09-19 LAB — COMPREHENSIVE METABOLIC PANEL
Alkaline Phosphatase: 178 U/L — ABNORMAL HIGH (ref 39–117)
BUN: 6 mg/dL (ref 6–23)
CO2: 24 mEq/L (ref 19–32)
Chloride: 108 mEq/L (ref 96–112)
Creatinine, Ser: 0.4 mg/dL — ABNORMAL LOW (ref 0.50–1.10)
GFR calc non Af Amer: >90 mL/min (ref >90)
Glucose, Bld: 133 mg/dL — ABNORMAL HIGH (ref 70–99)
Total Bilirubin: 0.5 mg/dL (ref 0.3–1.2)

## 2011-09-19 LAB — CBC
Platelets: 265 10*3/uL (ref 150–400)
RBC: 4.59 MIL/uL (ref 3.87–5.11)
RDW: 16.4 % — ABNORMAL HIGH (ref 11.5–15.5)
WBC: 11.8 10*3/uL — ABNORMAL HIGH (ref 4.0–10.5)

## 2011-09-19 MED ORDER — HYDROCODONE-ACETAMINOPHEN 5-325 MG PO TABS: 1.0000 | ORAL_TABLET | Freq: Four times a day (QID) | ORAL | Status: AC | PRN

## 2011-09-19 NOTE — Progress Notes (Signed)
Reviewed all discharge instructions w/ pt and husband through [employer] and copy of instructions given to pt.  Rx given to pt and explained for Vicodin.  Reviewed diet, FU appt, activity, meds, expectations and what to call MD for.  No further questions verbalized about home self care.

## 2011-09-19 NOTE — Progress Notes (Signed)
1 Day Post-Op  Subjective: Patient has tolerated diet and voiced no complaints to the nurses. Minimal pain, wants to go home  Objective: Vital signs in last 24 hours: Temp:  [97.9 F (36.6 C)-99.3 F (37.4 C)] 97.9 F (36.6 C) (12/29 0540) Pulse Rate:  [64-94] 64  (12/29 0540) Resp:  [16-23] 18  (12/29 0540) BP: (101-110)/(60-75) 101/60 mmHg (12/29 0540) SpO2:  [94 %-100 %] 98 % (12/29 0540) Last BM Date: 09/14/11  Intake/Output from previous day: 12/28 0701 - 12/29 0700 In: 4986.7 [I.V.:3586.7; IV Piggyback:1400] Out: 1050 [Urine:1000; Blood:50] Intake/Output this shift:    General appearance: alert and no distress Resp: clear to auscultation bilaterally GI: Soft and minimally tender at incision Incision/Wound: OK  Lab Results:   Basename 09/19/11 0600 09/18/11 0630  WBC 11.8* 14.7*  HGB 11.4* 12.2  HCT 34.7* 37.6  PLT 265 238   BMET  Basename 09/19/11 0600 09/18/11 0630  NA 141 139  K 3.9 3.6  CL 108 104  CO2 24 22  GLUCOSE 133* 121*  BUN 6 6  CREATININE 0.40* 0.47*  CALCIUM 8.7 8.8   PT/INR No results found for this basename: LABPROT:2,INR:2 in the last 72 hours ABG No results found for this basename: PHART:2,PCO2:2,PO2:2,HCO3:2 in the last 72 hours  Studies/Results: Dg Cholangiogram Operative  09/18/2011  *RADIOLOGY REPORT*  Clinical Data: Operative cholangiogram.  Cholecystectomy.  INTRAOPERATIVE CHOLANGIOGRAM  Comparison:  Ultrasound 12/25  Findings: Contrast is injected through the cystic duct remnant. There is good filling of the major intrahepatic ducts and the extrahepatic ductal system.  No filling defect or obstruction. Contrast passes freely into the duodenum.  There is some reflux into a normal appearing pancreatic duct.  IMPRESSION: Negative operative cholangiogram.  Original Report Authenticated By: Thomasenia Sales, M.D.    Anti-infectives: Anti-infectives     Start     Dose/Rate Route Frequency Ordered Stop   09/15/11 2100   metroNIDAZOLE  (FLAGYL) IVPB 500 mg  Status:  Discontinued        500 mg 100 mL/hr over 60 Minutes Intravenous Every 8 hours 09/15/11 1907 09/17/11 1413   09/15/11 1915   ciprofloxacin (CIPRO) IVPB 400 mg        400 mg 200 mL/hr over 60 Minutes Intravenous Every 12 hours 09/15/11 1907            Assessment/Plan: s/p Procedure(s): LAPAROSCOPIC CHOLECYSTECTOMY WITH INTRAOPERATIVE CHOLANGIOGRAM Discharge Believe patient able to go home with follow up in our office in about three weeks  LOS: 4 days    Maddox Bratcher J 09/19/2011

## 2011-09-19 NOTE — Discharge Summary (Signed)
Physician Discharge Summary  Patient ID: Emma Beard MRN: [id number] DOB/AGE: Aug 28, 1976 35 y.o.  Admit date: 09/15/2011 Discharge date: 09/19/2011  Admission Diagnoses:  Discharge Diagnoses:  Principal Problem:  *Gallstone pancreatitis Active Problems:  Gestational diabetes   Discharged Condition: good  Hospital Course: Patient admitted with biliary pancreatitis. After that improved underwent LC and IOC, did well post op and home the next day  Consults: none    Treatments: surgery: Lap Chhole  Discharge Exam: Blood pressure 101/60, pulse 64, temperature 97.9 F (36.6 C), temperature source Oral, resp. rate 18, height 5\' 2"  (1.575 m), weight 140 lb (63.504 kg), SpO2 98.00%, currently breastfeeding. General appearance: alert and no distress GI: Normal post op exam  Disposition: Home or Self Care  Discharge Orders    Future Orders Please Complete By Expires   Diet - low sodium heart healthy      Increase activity slowly      No dressing needed        Medication List  As of 09/19/2011  8:22 AM   START taking these medications         * HYDROcodone-acetaminophen 5-325 MG per tablet   Commonly known as: NORCO   Take 1-2 tablets by mouth every 4 (four) hours as needed.      * HYDROcodone-acetaminophen 5-325 MG per tablet   Commonly known as: NORCO   Take 1 tablet by mouth every 6 (six) hours as needed for pain.     * Notice: This list has 2 medication(s) that are the same as other medications prescribed for you. Read the directions carefully, and ask your doctor or other care provider to review them with you.       CONTINUE taking these medications         ibuprofen 600 MG tablet   Commonly known as: ADVIL,MOTRIN   Take 1 tablet (600 mg total) by mouth every 6 (six) hours as needed for pain.      PRENATAL VITAMINS PO         STOP taking these medications         ondansetron 8 MG disintegrating tablet          Where to get your medications      These are the prescriptions that you need to pick up.   You may get these medications from any pharmacy.         HYDROcodone-acetaminophen 5-325 MG per tablet           Follow-up Information    Follow up with [facility]. Make an appointment in 3 weeks. (Ask for [facility] clinic or Dr. Lindie Spruce.  Call if you have a problem before your follow up appointment.)          Signed: Currie Paris 09/19/2011, 8:22 AM

## 2011-09-21 NOTE — ED Provider Notes (Signed)
Medical screening examination/treatment/procedure(s) were performed by non-physician practitioner and as supervising physician I was immediately available for consultation/collaboration.   Gwyneth Sprout, MD 09/21/11 [phone]

## 2011-09-23 ENCOUNTER — Encounter (HOSPITAL_COMMUNITY): Payer: Self-pay | Admitting: General Surgery

## 2012-02-01 IMAGING — US US ABDOMEN COMPLETE
1 series · 14 of 25 positions shown · non-contrast
Comparison: Abdomen ultrasound, 09/11/2011

CLINICAL DATA: For quadrant pain with nausea and vomiting.

COMPLETE ABDOMINAL ULTRASOUND

[Series 1: us abdomen complete · 0.28mm/px · 14 of 58 slices shown]
[im 1/58]
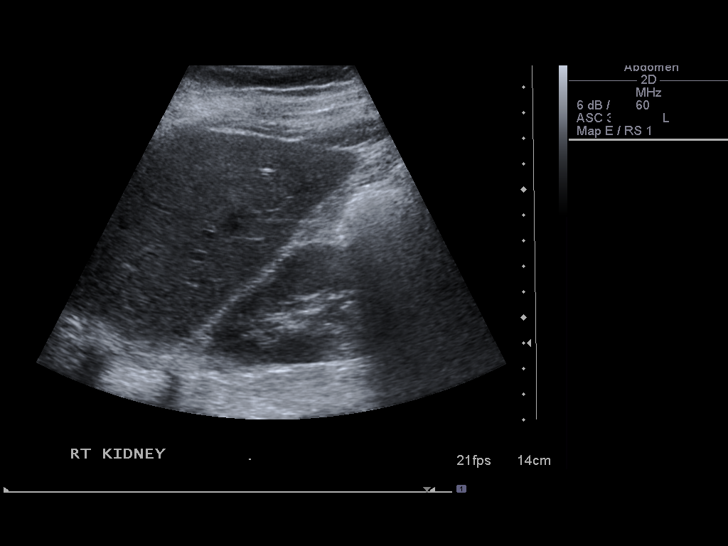
[im 5/58]
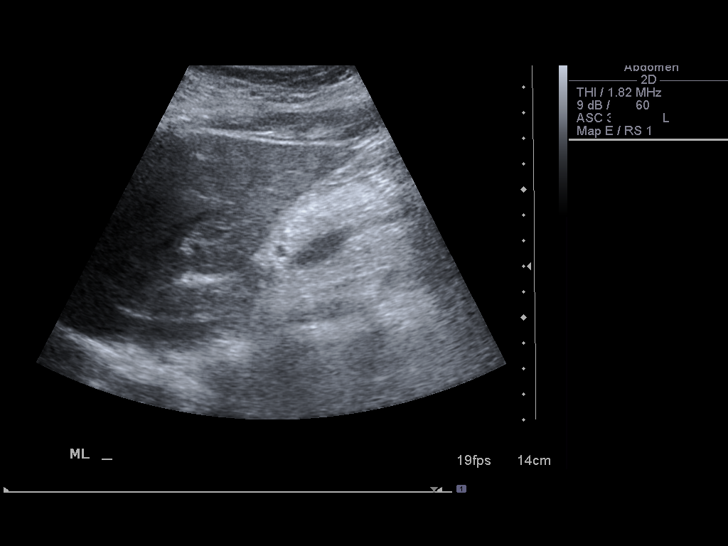
[im 10/58]
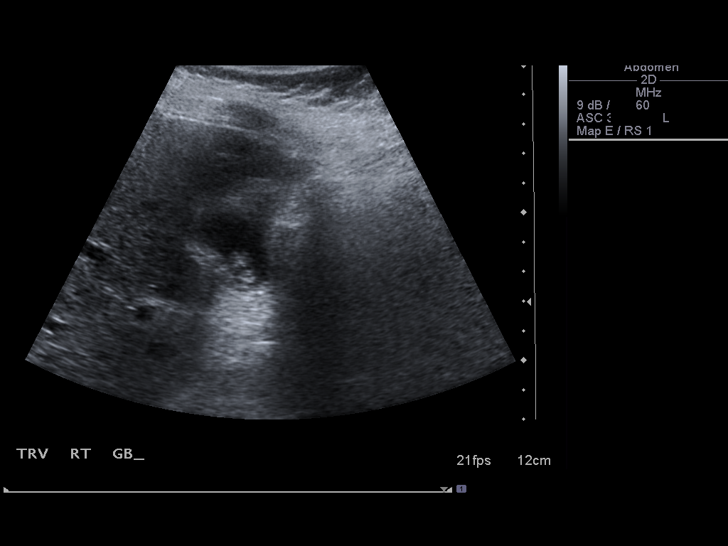
[im 15/58]
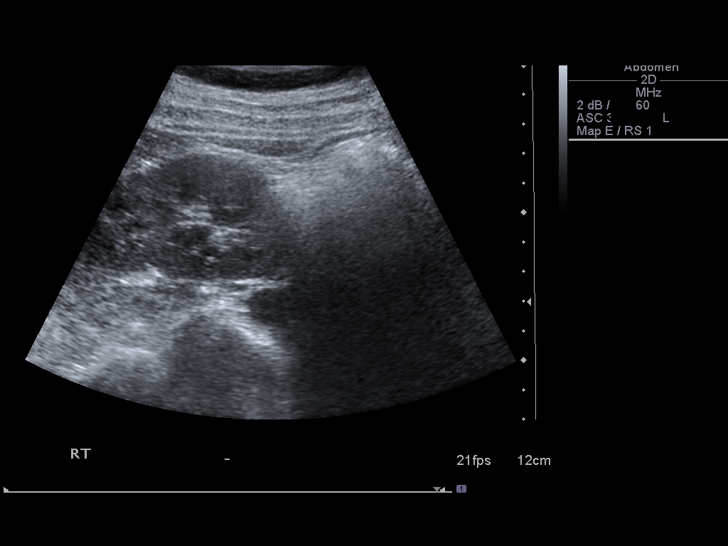
[im 20/58]
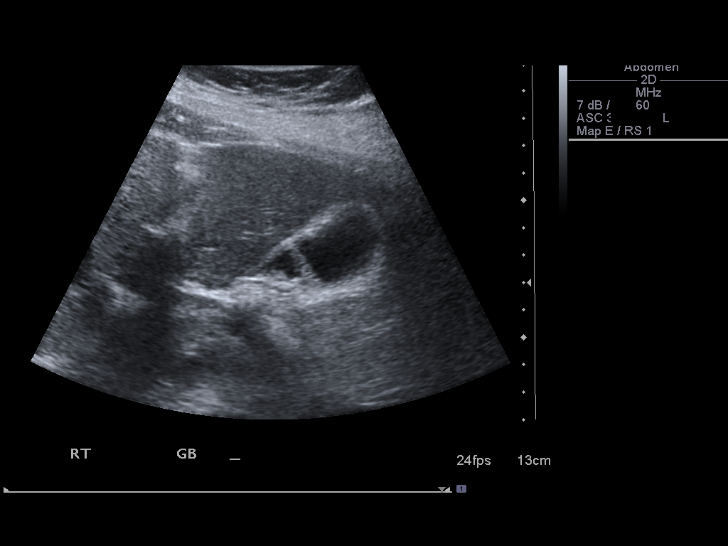
[im 22/58]
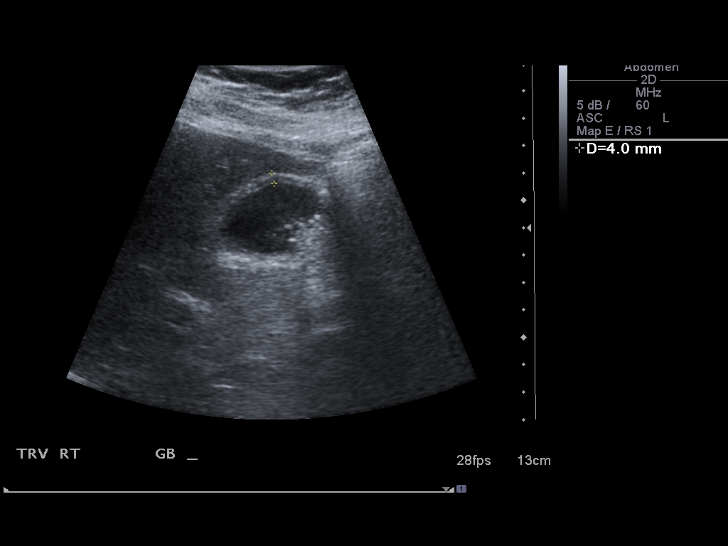
[im 27/58]
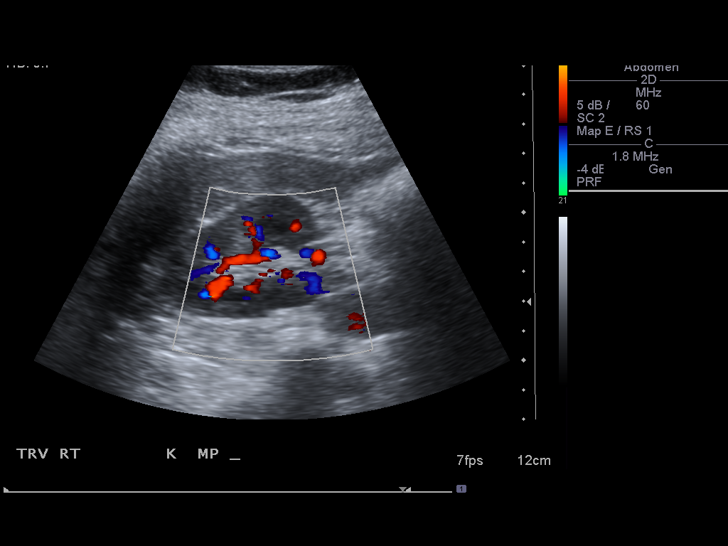
[im 31/58]
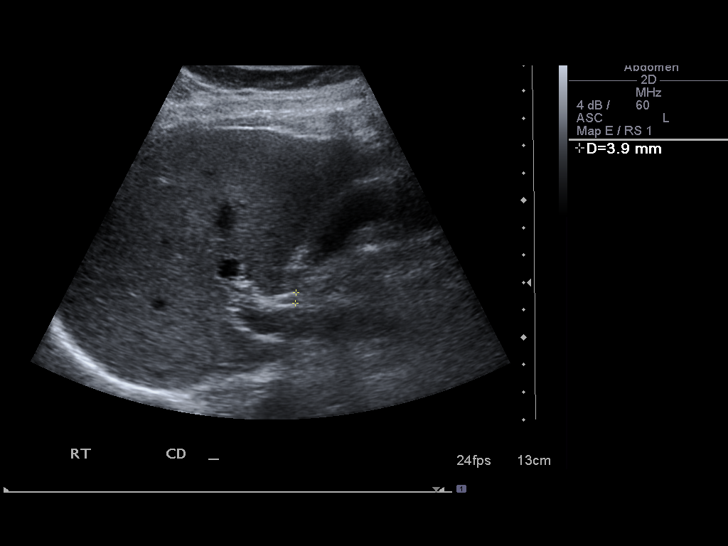
[im 36/58]
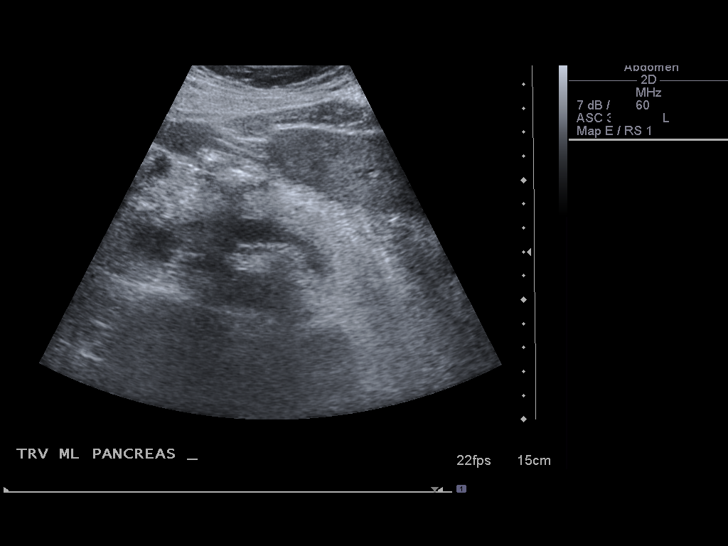
[im 39/58]
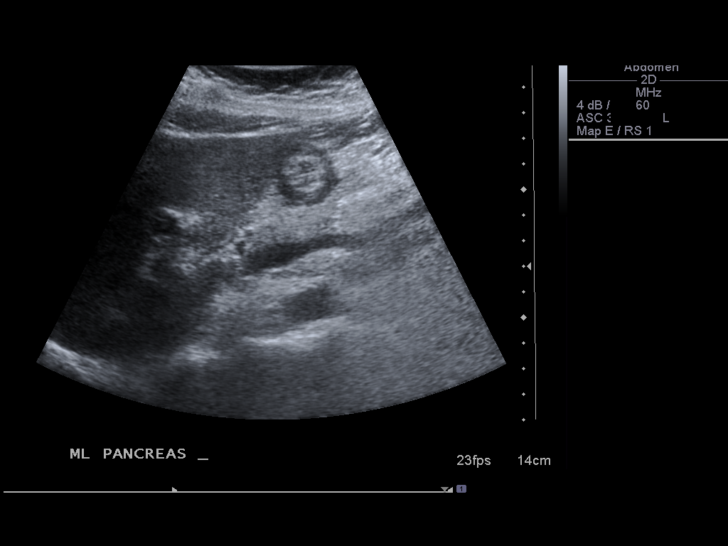
[im 43/58]
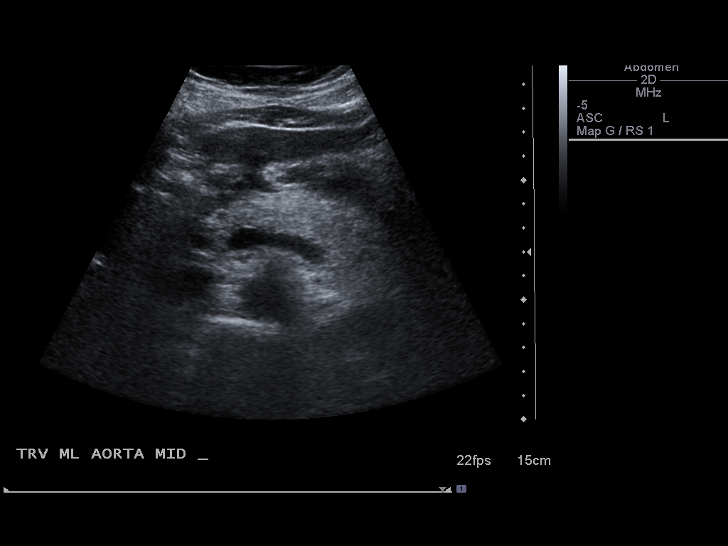
[im 48/58]
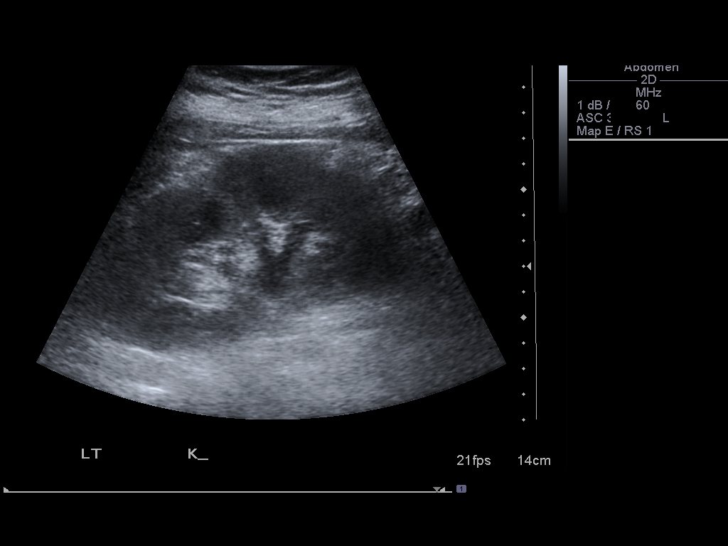
[im 53/58]
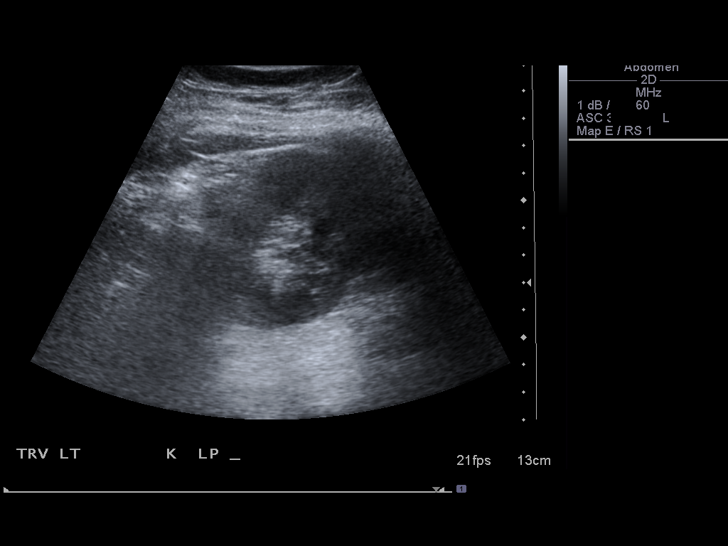
[im 58/58]
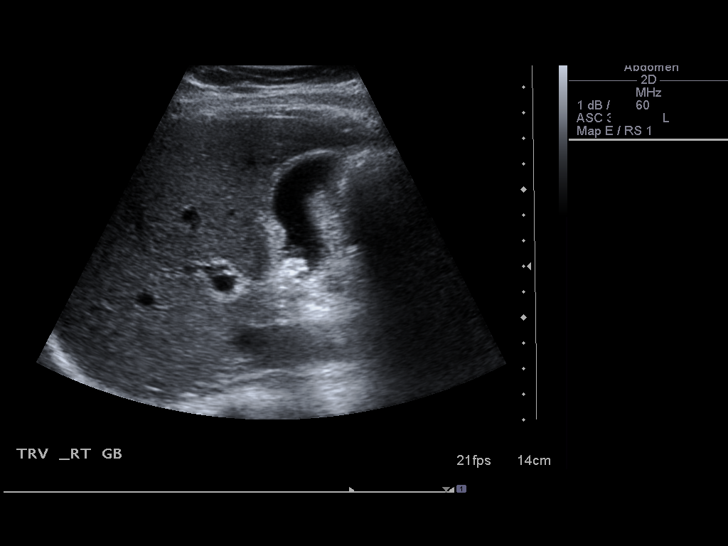

[14 of 25 positions shown; findings below may reference images not displayed]

FINDINGS: Gallbladder:  Dependent partly shadowing gallstones.  The
gallbladder wall measures 4 mm, mildly thickened.  There is no
pericholecystic fluid.

Common bile duct:  Normal in caliber measuring 4 mm

Liver:  No focal lesion identified.  Within normal limits in
parenchymal echogenicity.

IVC:  Appears normal.

Pancreas:  No focal abnormality seen.

Spleen:  Normal, measuring 9.2 cm in long axis

Right Kidney:  Normal, measuring 10.4 cm in long axis

Left Kidney:  Normal, measuring 13.6 cm in long axis

Abdominal aorta:  No aneurysm identified.
IMPRESSION: Gallstones with mild gallbladder wall thickening.  No
pericholecystic fluid.  Findings are equivocal, but could reflect
early acute cholecystitis in the proper clinical setting.

No other abnormalities.

## 2012-02-04 IMAGING — RF DG CHOLANGIOGRAM OPERATIVE
1 series · 3 of 3 positions shown · non-contrast
Comparison: Ultrasound [DATE]

CLINICAL DATA: Operative cholangiogram.  Cholecystectomy.

INTRAOPERATIVE CHOLANGIOGRAM

[Series 1: run · 3 of 17 frames shown]
[frame 3/17]
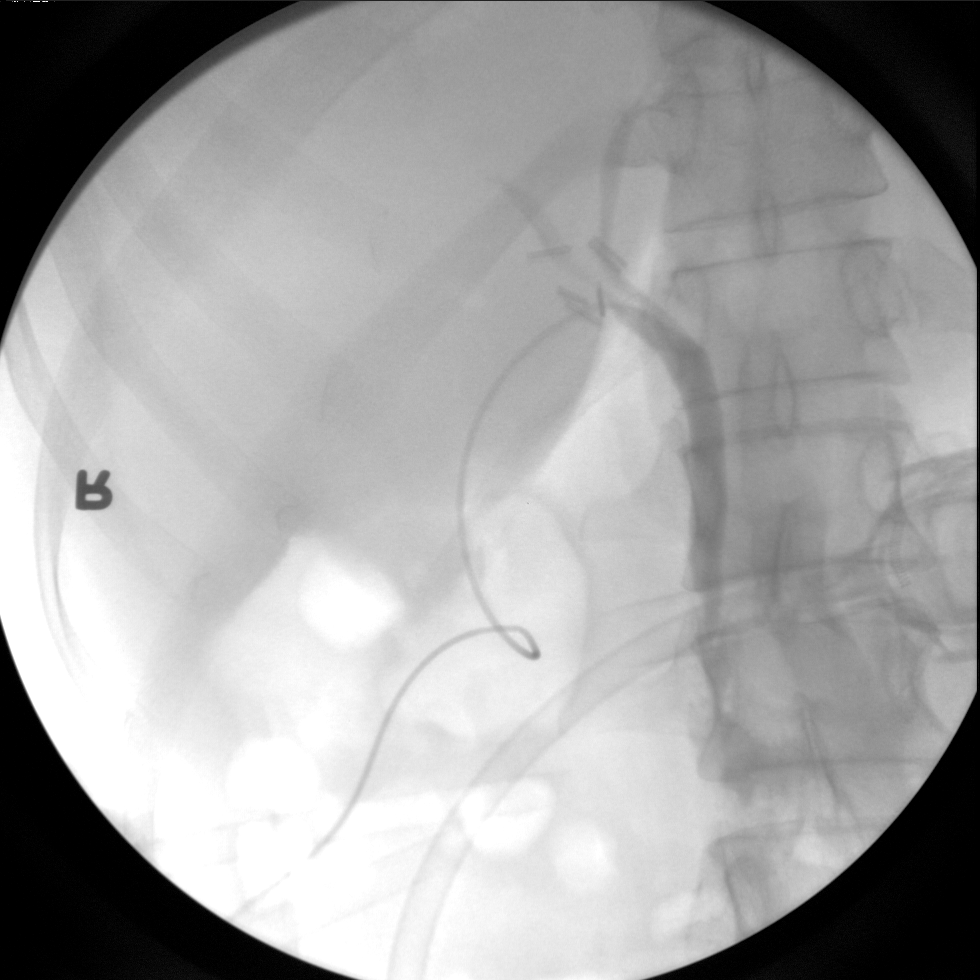
[frame 9/17]
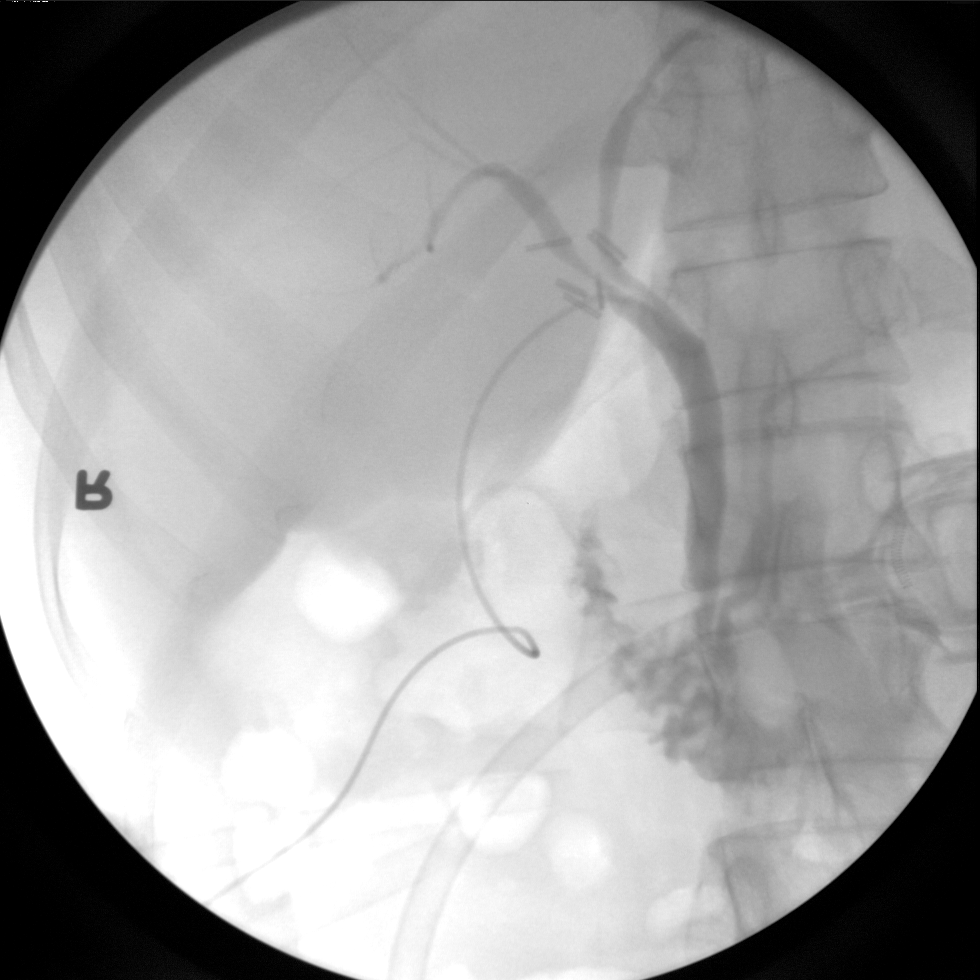
[frame 15/17]
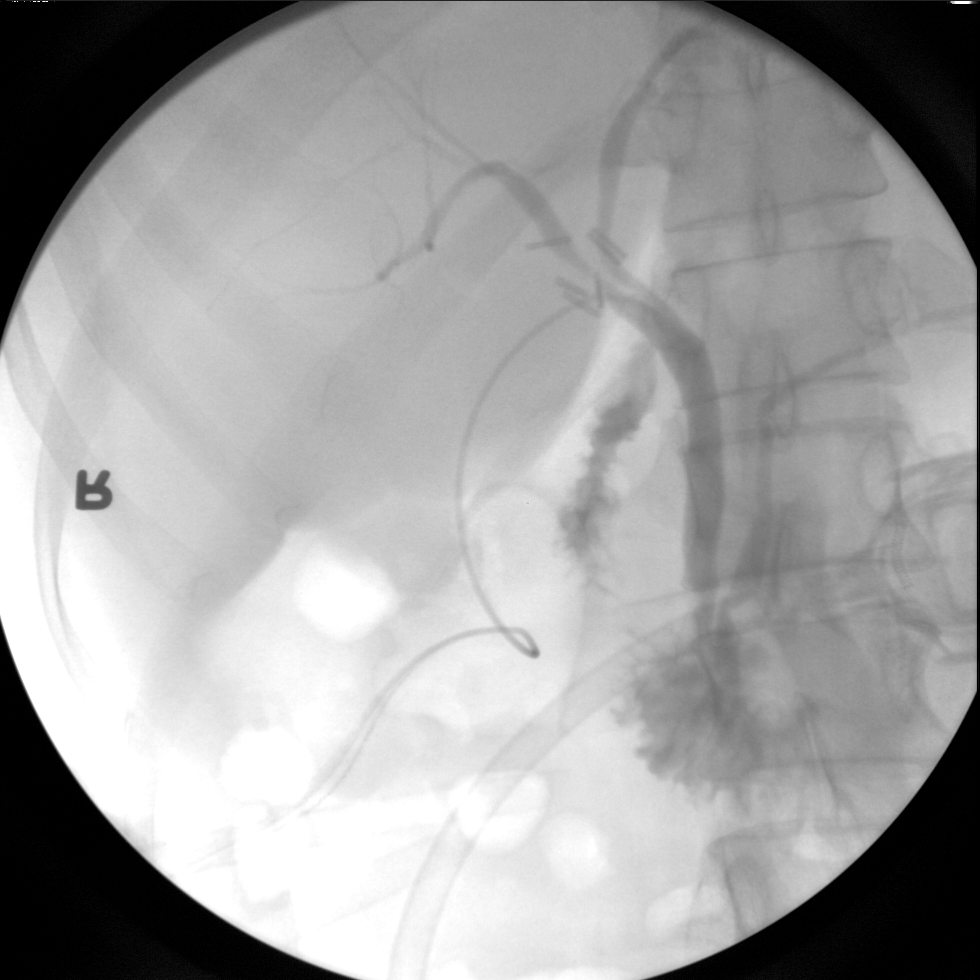

[3 of 3 positions shown; findings below may reference images not displayed]

FINDINGS: Contrast is injected through the cystic duct remnant.
There is good filling of the major intrahepatic ducts and the
extrahepatic ductal system.  No filling defect or obstruction.
Contrast passes freely into the duodenum.  There is some reflux
into a normal appearing pancreatic duct.
IMPRESSION: Negative operative cholangiogram.

## 2012-07-05 ENCOUNTER — Ambulatory Visit: Payer: Self-pay

## 2012-08-22 ENCOUNTER — Other Ambulatory Visit: Payer: Self-pay

## 2012-08-22 LAB — OB RESULTS CONSOLE RUBELLA ANTIBODY, IGM: Rubella: IMMUNE

## 2012-08-22 LAB — OB RESULTS CONSOLE HIV ANTIBODY (ROUTINE TESTING): HIV: NONREACTIVE

## 2012-08-22 LAB — OB RESULTS CONSOLE RPR: RPR: NONREACTIVE

## 2012-08-22 LAB — OB RESULTS CONSOLE ANTIBODY SCREEN: Antibody Screen: NEGATIVE

## 2012-09-21 NOTE — L&D Delivery Note (Signed)
Delivery Note At 11:34 AM a viable female was delivered via Vaginal, Vacuum (Extractor) (Presentation: ;  ).  APGAR: , ; weight .   Placenta status: , .  Cord:  with the following complications: .  Cord pH: not done  Anesthesia: Other  Episiotomy:  Lacerations:  Suture Repair: 2.0 vicryl Est. Blood Loss (mL):   Mom to postpartum.  Baby to nursery-stable.  MARSHALL,BERNARD A 01/27/2013, 11:45 AM

## 2012-11-24 ENCOUNTER — Other Ambulatory Visit (HOSPITAL_COMMUNITY): Payer: Self-pay | Admitting: Obstetrics

## 2012-11-24 DIAGNOSIS — O368131 Decreased fetal movements, third trimester, fetus 1: Secondary | ICD-10-CM

## 2012-11-24 DIAGNOSIS — O09523 Supervision of elderly multigravida, third trimester: Secondary | ICD-10-CM

## 2012-11-29 ENCOUNTER — Other Ambulatory Visit (HOSPITAL_COMMUNITY): Payer: Self-pay | Admitting: Obstetrics

## 2012-11-29 ENCOUNTER — Encounter (HOSPITAL_COMMUNITY): Payer: Self-pay

## 2012-11-29 ENCOUNTER — Other Ambulatory Visit (HOSPITAL_COMMUNITY): Payer: Self-pay

## 2012-11-29 ENCOUNTER — Ambulatory Visit (HOSPITAL_COMMUNITY)
Admission: RE | Admit: 2012-11-29 | Discharge: 2012-11-29 | Disposition: A | Discharge status: Home / Self Care | Payer: Self-pay | Source: Ambulatory Visit | Attending: Obstetrics | Admitting: Obstetrics

## 2012-11-29 ENCOUNTER — Ambulatory Visit (HOSPITAL_COMMUNITY): Payer: Self-pay

## 2012-11-29 VITALS — BP 108/70 | HR 96 | Wt 156.0 lb

## 2012-11-29 DIAGNOSIS — Z1389 Encounter for screening for other disorder: Secondary | ICD-10-CM | POA: Insufficient documentation

## 2012-11-29 DIAGNOSIS — O09299 Supervision of pregnancy with other poor reproductive or obstetric history, unspecified trimester: Secondary | ICD-10-CM | POA: Insufficient documentation

## 2012-11-29 DIAGNOSIS — Z369 Encounter for antenatal screening, unspecified: Secondary | ICD-10-CM

## 2012-11-29 DIAGNOSIS — O368131 Decreased fetal movements, third trimester, fetus 1: Secondary | ICD-10-CM

## 2012-11-29 DIAGNOSIS — Z363 Encounter for antenatal screening for malformations: Secondary | ICD-10-CM | POA: Insufficient documentation

## 2012-11-29 DIAGNOSIS — O09529 Supervision of elderly multigravida, unspecified trimester: Secondary | ICD-10-CM | POA: Insufficient documentation

## 2012-11-29 DIAGNOSIS — O358XX0 Maternal care for other (suspected) fetal abnormality and damage, not applicable or unspecified: Secondary | ICD-10-CM | POA: Insufficient documentation

## 2012-11-29 NOTE — Progress Notes (Signed)
MFM consult  Indication: 37 yr old G2P1001 at [redacted]w[redacted]d for fetal ultrasound secondary to decreased fetal movement.  Findings: 1. Single intrauterine pregnancy. 2. Estimated fetal weight is in the 61st%. The femur and humerus measure 3-4 weeks behind gestational age. 3. Anterior placenta without evidence of previa. 4. Normal amniotic fluid index. 5. The limited anatomy survey is normal. Anatomy is limited by advanced gestational age. 6. The appearance and shape of the long bones are normal. 7. Normal biophysical profile of 8/8.  Recommendations: 1. Appropriate fetal growth. 2. Normal limited anatomy survey. 3. Decreased fetal movement: - patient reports decreased fetal movement 5 days ago; since fetal movement has been normal and is normal today - discussed patient should call OB if has decreased fetal movement - normal BPP today 4. Lagging long bones: - discussed likely constitutional as patient is approximately 5'3" and father of baby is approximately 5'6" - discussed appearance and shape of bones is normal; this does not suggest severe skeletal dysplasia - other possible etiologies include: less severe (non lethal) skeletal dysplasia, aneuploidy, other genetic abnormality - no aneuploidy screening is seen in patient's chart - recommend follow up in 3 weeks to remeasure long bones; if normal interval growth likely constitutional; if lagging need to consider the other above options and inform Pediatrics at delivery 5. Advanced maternal age: - no consult requested - no aneuploidy screening is seen in the patient's chart  Emma Foster, MD

## 2012-12-20 ENCOUNTER — Ambulatory Visit (HOSPITAL_COMMUNITY)
Admission: RE | Admit: 2012-12-20 | Discharge: 2012-12-20 | Disposition: A | Discharge status: Home / Self Care | Payer: Self-pay | Source: Ambulatory Visit | Attending: Obstetrics | Admitting: Obstetrics

## 2012-12-20 DIAGNOSIS — O09529 Supervision of elderly multigravida, unspecified trimester: Secondary | ICD-10-CM | POA: Insufficient documentation

## 2012-12-20 DIAGNOSIS — Z3689 Encounter for other specified antenatal screening: Secondary | ICD-10-CM | POA: Insufficient documentation

## 2012-12-20 DIAGNOSIS — Z369 Encounter for antenatal screening, unspecified: Secondary | ICD-10-CM

## 2012-12-20 DIAGNOSIS — O09299 Supervision of pregnancy with other poor reproductive or obstetric history, unspecified trimester: Secondary | ICD-10-CM | POA: Insufficient documentation

## 2013-01-27 ENCOUNTER — Encounter (HOSPITAL_COMMUNITY): Payer: Self-pay | Admitting: *Deleted

## 2013-01-27 ENCOUNTER — Inpatient Hospital Stay (HOSPITAL_COMMUNITY)
Admission: AD | Admit: 2013-01-27 | Discharge: 2013-01-29 | DRG: 775 | Disposition: A | Discharge status: Home / Self Care | Payer: Medicaid Other | Source: Ambulatory Visit | Attending: Obstetrics | Admitting: Obstetrics

## 2013-01-27 DIAGNOSIS — O480 Post-term pregnancy: Secondary | ICD-10-CM | POA: Diagnosis present

## 2013-01-27 DIAGNOSIS — O09529 Supervision of elderly multigravida, unspecified trimester: Secondary | ICD-10-CM | POA: Diagnosis present

## 2013-01-27 LAB — CBC
Platelets: 199 10*3/uL (ref 150–400)
RBC: 4.6 MIL/uL (ref 3.87–5.11)
WBC: 14.8 10*3/uL — ABNORMAL HIGH (ref 4.0–10.5)

## 2013-01-27 LAB — TYPE AND SCREEN: ABO/RH(D): O POS

## 2013-01-27 LAB — RPR: RPR Ser Ql: NONREACTIVE

## 2013-01-27 MED ORDER — IBUPROFEN 600 MG PO TABS
600.0000 mg | ORAL_TABLET | Freq: Four times a day (QID) | ORAL | Status: DC | PRN
  Administered 2013-01-28: 600 mg via ORAL
  Filled 2013-01-27 (×5): qty 1

## 2013-01-27 MED ORDER — OXYTOCIN BOLUS FROM INFUSION: 500.0000 mL | INTRAVENOUS | Status: DC

## 2013-01-27 MED ORDER — CITRIC ACID-SODIUM CITRATE 334-500 MG/5ML PO SOLN: 30.0000 mL | ORAL | Status: DC | PRN

## 2013-01-27 MED ORDER — DIPHENHYDRAMINE HCL 25 MG PO CAPS: 25.0000 mg | ORAL_CAPSULE | Freq: Four times a day (QID) | ORAL | Status: DC | PRN

## 2013-01-27 MED ORDER — ONDANSETRON HCL 4 MG/2ML IJ SOLN: 4.0000 mg | Freq: Four times a day (QID) | INTRAMUSCULAR | Status: DC | PRN

## 2013-01-27 MED ORDER — WITCH HAZEL-GLYCERIN EX PADS: 1.0000 "application " | MEDICATED_PAD | CUTANEOUS | Status: DC | PRN

## 2013-01-27 MED ORDER — OXYCODONE-ACETAMINOPHEN 5-325 MG PO TABS: 1.0000 | ORAL_TABLET | ORAL | Status: DC | PRN

## 2013-01-27 MED ORDER — BUTORPHANOL TARTRATE 1 MG/ML IJ SOLN
1.0000 mg | INTRAMUSCULAR | Status: DC | PRN
  Administered 2013-01-27 (×5): 1 mg via INTRAVENOUS
  Filled 2013-01-27 (×5): qty 1

## 2013-01-27 MED ORDER — ONDANSETRON HCL 4 MG/2ML IJ SOLN: 4.0000 mg | INTRAMUSCULAR | Status: DC | PRN

## 2013-01-27 MED ORDER — ACETAMINOPHEN 325 MG PO TABS: 650.0000 mg | ORAL_TABLET | ORAL | Status: DC | PRN

## 2013-01-27 MED ORDER — LACTATED RINGERS IV SOLN
500.0000 mL | INTRAVENOUS | Status: DC | PRN
Start: 2013-01-27 — End: 2013-01-27

## 2013-01-27 MED ORDER — PRENATAL MULTIVITAMIN CH
1.0000 | ORAL_TABLET | Freq: Every day | ORAL | Status: DC
  Administered 2013-01-28 – 2013-01-29 (×2): 1 via ORAL
  Filled 2013-01-27 (×2): qty 1

## 2013-01-27 MED ORDER — ZOLPIDEM TARTRATE 5 MG PO TABS: 5.0000 mg | ORAL_TABLET | Freq: Every evening | ORAL | Status: DC | PRN

## 2013-01-27 MED ORDER — TETANUS-DIPHTH-ACELL PERTUSSIS 5-2.5-18.5 LF-MCG/0.5 IM SUSP: 0.5000 mL | Freq: Once | INTRAMUSCULAR | Status: DC

## 2013-01-27 MED ORDER — SENNOSIDES-DOCUSATE SODIUM 8.6-50 MG PO TABS
2.0000 | ORAL_TABLET | Freq: Every day | ORAL | Status: DC
  Administered 2013-01-27 – 2013-01-28 (×2): 2 via ORAL

## 2013-01-27 MED ORDER — LANOLIN HYDROUS EX OINT: TOPICAL_OINTMENT | CUTANEOUS | Status: DC | PRN

## 2013-01-27 MED ORDER — DIBUCAINE 1 % RE OINT: 1.0000 "application " | TOPICAL_OINTMENT | RECTAL | Status: DC | PRN

## 2013-01-27 MED ORDER — LACTATED RINGERS IV SOLN
INTRAVENOUS | Status: DC
  Administered 2013-01-27: 05:00:00 via INTRAVENOUS

## 2013-01-27 MED ORDER — ONDANSETRON HCL 4 MG PO TABS: 4.0000 mg | ORAL_TABLET | ORAL | Status: DC | PRN

## 2013-01-27 MED ORDER — LIDOCAINE HCL (PF) 1 % IJ SOLN
30.0000 mL | INTRAMUSCULAR | Status: DC | PRN
  Administered 2013-01-27: 30 mL via SUBCUTANEOUS
  Filled 2013-01-27 (×2): qty 30

## 2013-01-27 MED ORDER — FERROUS SULFATE 325 (65 FE) MG PO TABS
325.0000 mg | ORAL_TABLET | Freq: Two times a day (BID) | ORAL | Status: DC
  Administered 2013-01-27 – 2013-01-29 (×4): 325 mg via ORAL
  Filled 2013-01-27 (×4): qty 1

## 2013-01-27 MED ORDER — SIMETHICONE 80 MG PO CHEW: 80.0000 mg | CHEWABLE_TABLET | ORAL | Status: DC | PRN

## 2013-01-27 MED ORDER — IBUPROFEN 600 MG PO TABS
600.0000 mg | ORAL_TABLET | Freq: Four times a day (QID) | ORAL | Status: DC
  Administered 2013-01-27 – 2013-01-29 (×7): 600 mg via ORAL
  Filled 2013-01-27 (×3): qty 1

## 2013-01-27 MED ORDER — BENZOCAINE-MENTHOL 20-0.5 % EX AERO
1.0000 "application " | INHALATION_SPRAY | CUTANEOUS | Status: DC | PRN
  Administered 2013-01-27: 1 via TOPICAL
  Filled 2013-01-27: qty 56

## 2013-01-27 MED ORDER — FLEET ENEMA 7-19 GM/118ML RE ENEM: 1.0000 | ENEMA | Freq: Once | RECTAL | Status: DC

## 2013-01-27 MED ORDER — NALOXONE HCL 0.4 MG/ML IJ SOLN
INTRAMUSCULAR | Status: AC
  Filled 2013-01-27: qty 1

## 2013-01-27 MED ORDER — OXYTOCIN 40 UNITS IN LACTATED RINGERS INFUSION - SIMPLE MED
62.5000 mL/h | INTRAVENOUS | Status: DC
  Administered 2013-01-27: 999 mL/h via INTRAVENOUS
  Filled 2013-01-27: qty 1000

## 2013-01-27 NOTE — H&P (Signed)
This is Dr. Francoise Ceo dictating the history and physical on Emma Beard  she's a 37 year old gravida 2 para 1001 at 41 weeks and 3 days  Refused  induction EDC 01/17/2013 negative GBS admitted in labor cervix now 6 cm 100% vertex -1 amniotomy performed the fluids clear contracting every 2 minutes Past medical history negative Past surgical history negative Social history noncontributory System review negative Physical exam well-developed female in labor HEENT negative Lungs clear to P&A Breasts negative Heart regular rhythm no murmurs no gallops Abdomen term Pelvic as described above Extremities negative

## 2013-01-28 LAB — CBC
HCT: 28.4 % — ABNORMAL LOW (ref 36.0–46.0)
Hemoglobin: 9.3 g/dL — ABNORMAL LOW (ref 12.0–15.0)
MCH: 26.7 pg (ref 26.0–34.0)
MCV: 81.6 fL (ref 78.0–100.0)
RBC: 3.48 MIL/uL — ABNORMAL LOW (ref 3.87–5.11)

## 2013-01-29 MED ORDER — OXYCODONE-ACETAMINOPHEN 5-325 MG PO TABS: 1.0000 | ORAL_TABLET | ORAL | Status: DC | PRN

## 2013-01-29 MED ORDER — IBUPROFEN 600 MG PO TABS: 600.0000 mg | ORAL_TABLET | Freq: Four times a day (QID) | ORAL | Status: DC | PRN

## 2013-01-29 NOTE — Progress Notes (Signed)
Post Partum Day 2 Subjective: no complaints  Objective: Blood pressure 91/57, pulse 73, temperature 97.9 F (36.6 C), temperature source Oral, resp. rate 18, height 5\' 2"  (1.575 m), weight 164 lb (74.39 kg), last menstrual period 04/03/2012, SpO2 96.00%, unknown if currently breastfeeding.  Physical Exam:  General: alert Lochia: appropriate Uterine Fundus: firm Incision: healing well DVT Evaluation: No evidence of DVT seen on physical exam.   Recent Labs  01/27/13 0505 01/28/13 0713  HGB 12.6 9.3*  HCT 37.4 28.4*    Assessment/Plan: Discharge home   LOS: 2 days   Andra Heslin A 01/29/2013, 12:15 PM

## 2013-01-29 NOTE — Discharge Summary (Signed)
Obstetric Discharge Summary Reason for Admission: onset of labor Prenatal Procedures: ultrasound Intrapartum Procedures: spontaneous vaginal delivery Postpartum Procedures: none Complications-Operative and Postpartum: none Hemoglobin  Date Value Range Status  01/28/2013 9.3* 12.0 - 15.0 g/dL Final     REPEATED TO VERIFY     DELTA CHECK NOTED     HCT  Date Value Range Status  01/28/2013 28.4* 36.0 - 46.0 % Final    Physical Exam:  General: alert and no distress Lochia: appropriate Uterine Fundus: firm Incision: healing well DVT Evaluation: No evidence of DVT seen on physical exam.  Discharge Diagnoses: Term Pregnancy-delivered  Discharge Information: Date: 01/29/2013 Activity: pelvic rest Diet: routine Medications: PNV, Ibuprofen, Colace and Percocet Condition: stable Instructions: refer to practice specific booklet Discharge to: home Follow-up Information   Follow up with MARSHALL,BERNARD A, MD. Schedule an appointment as soon as possible for a visit in 6 weeks.   Contact information:   [address] [place] [address] [phone]       Newborn Data: Live born female  Birth Weight: 8 lb 5.2 oz (3775 g) APGAR: 9, 9  Home with mother.  Emma Beard A 01/29/2013, 12:19 PM

## 2013-01-30 ENCOUNTER — Inpatient Hospital Stay (HOSPITAL_COMMUNITY): Admission: RE | Admit: 2013-01-30 | Payer: Self-pay | Source: Ambulatory Visit

## 2013-01-30 NOTE — Progress Notes (Signed)
Post discharge chart review completed.  

## 2014-07-23 ENCOUNTER — Encounter (HOSPITAL_COMMUNITY): Payer: Self-pay | Admitting: *Deleted

## 2016-12-02 ENCOUNTER — Encounter (INDEPENDENT_AMBULATORY_CARE_PROVIDER_SITE_OTHER): Payer: Self-pay | Admitting: Physician Assistant

## 2016-12-02 ENCOUNTER — Ambulatory Visit (INDEPENDENT_AMBULATORY_CARE_PROVIDER_SITE_OTHER): Payer: Self-pay | Admitting: Physician Assistant

## 2016-12-02 VITALS — BP 121/80 | HR 80 | Temp 98.2°F | Ht 61.5 in | Wt 155.5 lb

## 2016-12-02 DIAGNOSIS — G8929 Other chronic pain: Secondary | ICD-10-CM

## 2016-12-02 DIAGNOSIS — K219 Gastro-esophageal reflux disease without esophagitis: Secondary | ICD-10-CM

## 2016-12-02 DIAGNOSIS — R1013 Epigastric pain: Secondary | ICD-10-CM

## 2016-12-02 MED ORDER — OMEPRAZOLE 40 MG PO CPDR: 40.0000 mg | DELAYED_RELEASE_CAPSULE | Freq: Every day | ORAL | 3 refills | Status: DC

## 2016-12-02 NOTE — Progress Notes (Signed)
     Subjective:  Patient ID: Emma Beard, female    DOB: [birth year]  Age: 41 y.o. MRN: [id number]  CC:  Abdominal pain HPI Emma Beard is a 41 y.o. female with a PMH of cholecystectomy Presents for epigastric pain off an on for approximately 6 months. Pain radiates to the back. Also feels acid reflux to throat, has heartburn, abdominal distention, constipation, and sleeps inclined to avoid reflux. Has drank teas with no relief of symptoms. Reports that spicy foods, greasy foods, coffee, and chocolate aggravate epigastric pain. Unassociated headache at times. Denies chest pain, shortness of breath, melena, hematochezia, diarrhea, fever, chills, nausea, vomiting, rash, or GU symptoms.    ROS Review of Systems  Constitutional: Negative for chills, fever and malaise/fatigue.  Eyes: Negative for blurred vision.  Respiratory: Negative for shortness of breath.   Cardiovascular: Negative for chest pain and palpitations.  Gastrointestinal: Positive for abdominal pain (epigastric) and constipation. Negative for blood in stool, diarrhea, melena, nausea and vomiting.  Genitourinary: Negative for dysuria and hematuria.  Musculoskeletal: Negative for joint pain and myalgias.  Skin: Negative for rash.  Neurological: Positive for headaches. Negative for tingling.  Psychiatric/Behavioral: Negative for depression. The patient is not nervous/anxious.     Objective:  BP 121/80 (BP Location: Right Arm, Patient Position: Sitting, Cuff Size: Normal)   Pulse 80   Temp 98.2 F (36.8 C) (Oral)   Ht 5' 1.5" (1.562 m)   Wt 155 lb 8 oz (70.5 kg)   SpO2 96%   Breastfeeding? No   BMI 28.91 kg/m   BP/Weight 12/02/2016 01/29/2013 01/27/2013  Systolic BP 121 91 -  Diastolic BP 80 57 -  Wt. (Lbs) 155.5 - 164  BMI 28.91 - 29.99      Physical Exam  Constitutional: She is oriented to person, place, and time.  Well developed, overweight, NAD, polite  HENT:  Head: Normocephalic and  atraumatic.  Eyes: No scleral icterus.  Neck: Normal range of motion. Neck supple. No thyromegaly present.  Cardiovascular: Normal rate, regular rhythm and normal heart sounds.   Pulmonary/Chest: Effort normal and breath sounds normal.  Abdominal: Soft. Bowel sounds are normal. She exhibits no distension and no mass. There is tenderness (epigastric). There is no rebound and no guarding.  Musculoskeletal: She exhibits no edema.  Neurological: She is alert and oriented to person, place, and time.  Skin: Skin is warm and dry. No rash noted. She is not diaphoretic. No erythema. No pallor.  Psychiatric: She has a normal mood and affect. Her behavior is normal. Thought content normal.  Vitals reviewed.    Assessment & Plan:   1. Abdominal pain, chronic, epigastric - Lipase - H. pylori antibody, IgG - CBC with Differential/Platelet - Comprehensive metabolic panel  2. Gastroesophageal reflux disease, esophagitis presence not specified -Omeprazole 40mg  qday   Meds ordered this encounter  Medications  . omeprazole (PRILOSEC) 40 MG capsule    Sig: Take 1 capsule (40 mg total) by mouth daily.    Dispense:  30 capsule    Refill:  3    Order Specific Question:   Supervising Provider    Answer:   Quentin AngstJEGEDE, OLUGBEMIGA E [[id number]]    Follow-up: Return in about 1 week (around 12/09/2016) for physical exam, annual.   Loletta Specteroger David Emmilee Reamer PA

## 2016-12-02 NOTE — Patient Instructions (Signed)
Opciones de alimentos para pacientes con reflujo gastroesofgico - Adultos (Food Choices for Gastroesophageal Reflux Disease, Adult) Cuando se tiene reflujo gastroesofgico (ERGE), los alimentos que se ingieren y los hbitos de alimentacin son muy importantes. Elegir los alimentos adecuados puede ayudar a aliviar las molestias ocasionadas por el ERGE. QU PAUTAS GENERALES DEBO SEGUIR?  Elija las frutas, los vegetales, los cereales integrales, los productos lcteos, la carne de vaca, de pescado y de ave con bajo contenido de grasas.  Limite las grasas, como los aceites, los aderezos para ensalada, la manteca, los frutos secos y el aguacate.  Lleve un registro de las comidas para identificar los alimentos que ocasionan sntomas.  Evite los alimentos que le ocasionen reflujo. Pueden ser distintos para cada persona.  Haga comidas pequeas con frecuencia en lugar de tres comidas abundantes todos los das.  Coma lentamente, en un clima distendido.  Limite el consumo de alimentos fritos.  Cocine los alimentos utilizando mtodos que no sean la fritura.  Evite el consumo alcohol.  Evite beber grandes cantidades de lquidos con las comidas.  Evite agacharse o recostarse hasta despus de 2 o 3horas de haber comido.  QU ALIMENTOS NO SE RECOMIENDAN? Los siguientes son algunos alimentos y bebidas que pueden empeorar los sntomas: Vegetales Tomates. Jugo de tomate. Salsa de tomate y espagueti. Ajes. Cebolla y ajo. Rbano picante. Frutas Naranjas, pomelos y limn (fruta y jugo). Carnes Carnes de vaca, de pescado y de ave con gran contenido de grasas. Esto incluye los perros calientes, las costillas, el jamn, la salchicha, el salame y el tocino. Lcteos Leche entera y leche chocolatada. Crema cida. Crema. Mantequilla. Helados. Queso crema. Bebidas Caf y t negro, con o sin cafena Bebidas gaseosas o energizantes. Condimentos Salsa picante. Salsa barbacoa. Dulces/postres Chocolate y  cacao. Rosquillas. Menta y mentol. Grasas y aceites Alimentos con alto contenido de grasas, incluidas las papas fritas. Otros Vinagre. Especias picantes, como la pimienta negra, la pimienta blanca, la pimienta roja, la pimienta de cayena, el curry en polvo, los clavos de olor, el jengibre y el chile en polvo. Los artculos mencionados arriba pueden no ser una lista completa de las bebidas y los alimentos que se deben evitar. Comunquese con el nutricionista para recibir ms informacin. Esta informacin no tiene como fin reemplazar el consejo del mdico. Asegrese de hacerle al mdico cualquier pregunta que tenga. Document Released: 06/17/2005 Document Revised: 09/28/2014 Document Reviewed: 07/12/2013 Elsevier Interactive Patient Education  2017 [employer]  

## 2016-12-03 LAB — CBC WITH DIFFERENTIAL/PLATELET
BASOS: 0 %
Basophils Absolute: 0 10*3/uL (ref 0.0–0.2)
EOS (ABSOLUTE): 0.1 10*3/uL (ref 0.0–0.4)
EOS: 2 %
HEMATOCRIT: 42.6 % (ref 34.0–46.6)
Hemoglobin: 14.2 g/dL (ref 11.1–15.9)
Immature Grans (Abs): 0 10*3/uL (ref 0.0–0.1)
Immature Granulocytes: 0 %
LYMPHS ABS: 2.9 10*3/uL (ref 0.7–3.1)
Lymphs: 34 %
MCH: 28.5 pg (ref 26.6–33.0)
MCHC: 33.3 g/dL (ref 31.5–35.7)
MCV: 86 fL (ref 79–97)
MONOS ABS: 0.4 10*3/uL (ref 0.1–0.9)
Monocytes: 4 %
NEUTROS ABS: 5.1 10*3/uL (ref 1.4–7.0)
Neutrophils: 60 %
Platelets: 235 10*3/uL (ref 150–379)
RBC: 4.98 x10E6/uL (ref 3.77–5.28)
RDW: 13.8 % (ref 12.3–15.4)
WBC: 8.5 10*3/uL (ref 3.4–10.8)

## 2016-12-03 LAB — COMPREHENSIVE METABOLIC PANEL
A/G RATIO: 1.6 (ref 1.2–2.2)
ALT: 20 IU/L (ref 0–32)
AST: 17 IU/L (ref 0–40)
Albumin: 4.5 g/dL (ref 3.5–5.5)
Alkaline Phosphatase: 62 IU/L (ref 39–117)
BUN/Creatinine Ratio: 23 (ref 9–23)
BUN: 13 mg/dL (ref 6–24)
Bilirubin Total: 0.3 mg/dL (ref 0.0–1.2)
CALCIUM: 9.1 mg/dL (ref 8.7–10.2)
CO2: 23 mmol/L (ref 18–29)
Chloride: 102 mmol/L (ref 96–106)
Creatinine, Ser: 0.56 mg/dL — ABNORMAL LOW (ref 0.57–1.00)
GFR calc Af Amer: 134 mL/min/{1.73_m2} (ref >59)
GFR, EST NON AFRICAN AMERICAN: 116 mL/min/{1.73_m2} (ref >59)
Globulin, Total: 2.8 g/dL (ref 1.5–4.5)
Glucose: 111 mg/dL — ABNORMAL HIGH (ref 65–99)
Potassium: 4.4 mmol/L (ref 3.5–5.2)
Sodium: 142 mmol/L (ref 134–144)
Total Protein: 7.3 g/dL (ref 6.0–8.5)

## 2016-12-03 LAB — H. PYLORI ANTIBODY, IGG: H. pylori, IgG AbS: <0.8 Index Value (ref 0.00–0.79)

## 2016-12-03 LAB — LIPASE: Lipase: 49 U/L (ref 14–72)

## 2016-12-11 ENCOUNTER — Ambulatory Visit (INDEPENDENT_AMBULATORY_CARE_PROVIDER_SITE_OTHER): Payer: Medicaid Other

## 2017-01-20 ENCOUNTER — Telehealth (INDEPENDENT_AMBULATORY_CARE_PROVIDER_SITE_OTHER): Payer: Self-pay | Admitting: Physician Assistant

## 2017-01-20 NOTE — Telephone Encounter (Signed)
Patient called stated wanted to if CAFA will cover lab bill,  Informed patient to call [facility] and let them know she was approved for CAFA

## 2018-06-22 ENCOUNTER — Encounter (INDEPENDENT_AMBULATORY_CARE_PROVIDER_SITE_OTHER): Payer: Self-pay | Admitting: Physician Assistant

## 2018-06-22 ENCOUNTER — Ambulatory Visit (INDEPENDENT_AMBULATORY_CARE_PROVIDER_SITE_OTHER): Payer: Self-pay | Admitting: Physician Assistant

## 2018-06-22 VITALS — BP 110/75 | HR 62 | Temp 98.3°F | Ht 62.0 in | Wt 161.2 lb

## 2018-06-22 DIAGNOSIS — S39012A Strain of muscle, fascia and tendon of lower back, initial encounter: Secondary | ICD-10-CM

## 2018-06-22 DIAGNOSIS — Z23 Encounter for immunization: Secondary | ICD-10-CM

## 2018-06-22 MED ORDER — CYCLOBENZAPRINE HCL 10 MG PO TABS: 10.0000 mg | ORAL_TABLET | Freq: Every day | ORAL | 0 refills | Status: DC

## 2018-06-22 MED ORDER — NAPROXEN 500 MG PO TABS: 500.0000 mg | ORAL_TABLET | Freq: Two times a day (BID) | ORAL | 0 refills | Status: DC

## 2018-06-22 NOTE — Progress Notes (Signed)
Subjective:  Patient ID: Emma Beard, female    DOB: 1975/11/19  Age: 42 y.o. MRN: [id number]  CC: back pain  HPI Jalyric Kaestner is a 42 y.o. female with no significant medical history presenting as a new patient to discuss lower back pain since one month ago. Says she fell from a swing two months ago with most of her weight landing in the right lateral decubitus position. Felt some pain at the right shoulder but later developed left hip pain. However right shoulder pain and left hip pain have resolved. Says her current pain feels like a burning in the lower back and radiates upward to the mid back and neck. Pain rated 7/10 and is worse when laying supine. Better when laying prone. Has found some mild relief when husband massages her back. Has not taken anything pills or applied cream/patches for relief. Does not endorse LE weakness, saddle paresthesia, urinary incontinence, fecal incontinence, or limited aROM.     ROS Review of Systems  Constitutional: Negative for chills, fever and malaise/fatigue.  Eyes: Negative for blurred vision.  Respiratory: Negative for shortness of breath.   Cardiovascular: Negative for chest pain and palpitations.  Gastrointestinal: Negative for abdominal pain and nausea.  Genitourinary: Negative for dysuria and hematuria.  Musculoskeletal: Positive for back pain. Negative for joint pain and myalgias.  Skin: Negative for rash.  Neurological: Negative for tingling and headaches.  Psychiatric/Behavioral: Negative for depression. The patient is not nervous/anxious.     Objective:  BP 110/75 (BP Location: Right Arm, Patient Position: Sitting, Cuff Size: Large)   Pulse 62   Temp 98.3 F (36.8 C) (Oral)   Ht 5\' 2"  (1.575 m)   Wt 161 lb 3.2 oz (73.1 kg)   LMP 06/08/2018 (Approximate)   SpO2 99%   BMI 29.48 kg/m   BP/Weight 06/22/2018  Systolic BP 110  Diastolic BP 75  Wt. (Lbs) 161.2  BMI 29.48      Physical Exam  Constitutional:  She is oriented to person, place, and time.  Well developed, well nourished, NAD, polite  HENT:  Head: Normocephalic and atraumatic.  Eyes: No scleral icterus.  Neck: Normal range of motion. Neck supple. No thyromegaly present.  Pulmonary/Chest: Effort normal.  Musculoskeletal: She exhibits no edema.  Back with full aROM, no TTP, mildly increased muscular tonicity of the paraspinals of the T and L spine.  Neurological: She is alert and oriented to person, place, and time.  Skin: Skin is warm and dry. No rash noted. No erythema. No pallor.  Psychiatric: She has a normal mood and affect. Her behavior is normal. Thought content normal.  Vitals reviewed.    Assessment & Plan:    1. Back strain, initial encounter - Begin naproxen (NAPROSYN) 500 MG tablet; Take 1 tablet (500 mg total) by mouth 2 (two) times daily with a meal.  Dispense: 20 tablet; Refill: 0 - Begin cyclobenzaprine (FLEXERIL) 10 MG tablet; Take 1 tablet (10 mg total) by mouth at bedtime.  Dispense: 10 tablet; Refill: 0 - I have personally shown patient how to perform upper back stretching.  2. Need for Tdap vaccination - Tdap vaccine greater than or equal to 7yo IM  3. Need for prophylactic vaccination and inoculation against influenza - Flu Vaccine QUAD 6+ mos PF IM (Fluarix Quad PF)   Meds ordered this encounter  Medications  . naproxen (NAPROSYN) 500 MG tablet    Sig: Take 1 tablet (500 mg total) by mouth 2 (two) times daily with a meal.  Dispense:  20 tablet    Refill:  0    Order Specific Question:   Supervising Provider    Answer:   Hoy Register [[id number]]  . cyclobenzaprine (FLEXERIL) 10 MG tablet    Sig: Take 1 tablet (10 mg total) by mouth at bedtime.    Dispense:  10 tablet    Refill:  0    Order Specific Question:   Supervising Provider    Answer:   Hoy Register [[id number]]    Follow-up: Return in about 2 months (around 08/22/2018) for annual physical .   Loletta Specter PA

## 2018-06-22 NOTE — Patient Instructions (Signed)
Distensión muscular.  (Muscle Strain)  Una distensión muscular es una lesión que se produce cuando un músculo se estira más allá de su largo normal. Cuando esto sucede, por lo general se desgarra un pequeño número de fibras musculares. La distensión muscular se califica en grados. Las distensiones de primer grado son aquellas en las cuales el desgarro y el dolor afectan a la menor cantidad de fibras musculares. Las distensiones de segundo y tercer grado involucran una proporción cada vez mayor de desgarro y dolor.  En general, la recuperación de una distensión muscular tarda de 1 a 2 semanas. La curación completa tarda de 5 a 6 semanas.  CAUSAS  Las distensiones musculares ocurren cuando se aplica una fuerza violenta y repentina sobre un músculo y este se estira demasiado. Esto puede ocurrir cuando se levantan objetos, se practican deportes o en una caída.  FACTORES DE RIESGO  La distensión muscular es especialmente común en los atletas.  SIGNOS Y SÍNTOMAS  En el lugar de la distensión muscular se puede presentar lo siguiente:  · Dolor.  · Moretones.  · Hinchazón.  · Dificultad para usar el músculo debido al dolor o a un funcionamiento anormal.  DIAGNÓSTICO  El médico le hará un examen físico y le hará preguntas sobre sus antecedentes médicos.  TRATAMIENTO  Con frecuencia, el mejor tratamiento para una distensión muscular es el reposo, y la aplicación de hielo y de compresas frías en la zona de la lesión.  INSTRUCCIONES PARA EL CUIDADO EN EL HOGAR  · Use el método PRICE (por sus siglas en inglés) de tratamiento para estimular la curación durante los primeros 2 a 3 días posteriores a la lesión. El método PRICE implica lo siguiente:  ? Proteger al músculo de nuevas lesiones.  ? Limitar la actividad y descansar la parte del cuerpo lesionada.  ? Aplicar hielo a la lesión. Para hacerlo, ponga hielo en una bolsa plástica. Coloque una toalla entre la piel y la bolsa de hielo. Luego aplique el hielo y déjelo actuar de 15 a  20 minutos por hora. Después del tercer día, cambie a compresas de calor húmedo.  ? Comprimir la zona lesionada con una férula o venda elástica. Tenga cuidado de no ajustarla demasiado. Esto puede interferir con la circulación sanguínea o aumentar la hinchazón.  ? Mantener la zona lesionada por encima del nivel del corazón con la mayor frecuencia posible.  · Utilice los medicamentos de venta libre o recetados para calmar el dolor, el malestar o la fiebre, según se lo indique el médico.  · Realizar un calentamiento antes de hacer ejercicio ayuda a prevenir distensiones musculares futuras.    SOLICITE ATENCIÓN MÉDICA SI:  · Siente un dolor cada vez más intenso o hinchazón en la zona lesionada.  · Siente adormecimiento, hormigueo o nota una pérdida importante de fuerza en la zona lesionada.    ASEGÚRESE DE QUE:  · Comprende estas instrucciones.  · Controlará su afección.  · Recibirá ayuda de inmediato si no mejora o si empeora.    Esta información no tiene como fin reemplazar el consejo del médico. Asegúrese de hacerle al médico cualquier pregunta que tenga.  Document Released: 06/17/2005 Document Revised: 06/28/2013 Document Reviewed: 04/06/2013  Elsevier Interactive Patient Education © 2017 Elsevier Inc.

## 2018-07-06 ENCOUNTER — Ambulatory Visit: Payer: Self-pay | Attending: Family Medicine

## 2018-08-24 ENCOUNTER — Other Ambulatory Visit (HOSPITAL_COMMUNITY)
Admission: RE | Admit: 2018-08-24 | Discharge: 2018-08-24 | Disposition: A | Discharge status: Home / Self Care | Payer: Self-pay | Source: Ambulatory Visit | Attending: Physician Assistant | Admitting: Physician Assistant

## 2018-08-24 ENCOUNTER — Encounter (INDEPENDENT_AMBULATORY_CARE_PROVIDER_SITE_OTHER): Payer: Self-pay | Admitting: Physician Assistant

## 2018-08-24 ENCOUNTER — Ambulatory Visit (INDEPENDENT_AMBULATORY_CARE_PROVIDER_SITE_OTHER): Payer: Self-pay | Admitting: Physician Assistant

## 2018-08-24 VITALS — BP 105/72 | HR 73 | Temp 98.3°F | Resp 16 | Ht 62.0 in | Wt 163.6 lb

## 2018-08-24 DIAGNOSIS — N858 Other specified noninflammatory disorders of uterus: Secondary | ICD-10-CM

## 2018-08-24 DIAGNOSIS — R7303 Prediabetes: Secondary | ICD-10-CM

## 2018-08-24 DIAGNOSIS — Z124 Encounter for screening for malignant neoplasm of cervix: Secondary | ICD-10-CM

## 2018-08-24 DIAGNOSIS — Z131 Encounter for screening for diabetes mellitus: Secondary | ICD-10-CM

## 2018-08-24 DIAGNOSIS — Z3202 Encounter for pregnancy test, result negative: Secondary | ICD-10-CM

## 2018-08-24 DIAGNOSIS — R319 Hematuria, unspecified: Secondary | ICD-10-CM

## 2018-08-24 DIAGNOSIS — Z0000 Encounter for general adult medical examination without abnormal findings: Secondary | ICD-10-CM

## 2018-08-24 LAB — POCT GLYCOSYLATED HEMOGLOBIN (HGB A1C): Hemoglobin A1C: 6.4 % — AB (ref 4.0–5.6)

## 2018-08-24 LAB — POCT URINALYSIS DIPSTICK
Bilirubin, UA: NEGATIVE
Glucose, UA: POSITIVE — AB
KETONES UA: NEGATIVE
Leukocytes, UA: NEGATIVE
Nitrite, UA: NEGATIVE
Protein, UA: POSITIVE — AB
Spec Grav, UA: 1.02 (ref 1.010–1.025)
Urobilinogen, UA: 0.2 E.U./dL
pH, UA: 5.5 (ref 5.0–8.0)

## 2018-08-24 LAB — POCT URINE PREGNANCY: Preg Test, Ur: NEGATIVE

## 2018-08-24 MED ORDER — METFORMIN HCL ER 500 MG PO TB24: 500.0000 mg | ORAL_TABLET | Freq: Every day | ORAL | 5 refills | Status: DC

## 2018-08-24 NOTE — Patient Instructions (Signed)
Plan de alimentacin para la prediabetes (Prediabetes Eating Plan) La prediabetes, tambin llamada intolerancia a la glucosa o alteracin de la glucosa en ayunas, es una afeccin que eleva los niveles de azcar en la sangre (glucemia) por encima de lo normal. Seguir una dieta saludable puede ayudar a mantener la prediabetes bajo control, y tambin reduce el riesgo de tener diabetes tipo2 y cardiopata, que es ms alto en las personas que tienen esta afeccin. Junto con la actividad fsica habitual, una dieta saludable:  Promueve la prdida de peso.  Ayuda a controlar los niveles de azcar en la sangre.  Ayuda a mejorar la forma en que el organismo usa la insulina. QU DEBO SABER ACERCA DE ESTE PLAN DE ALIMENTACIN?  Use el ndice glucmico (IG) para planificar las comidas. El ndice le informa con qu rapidez un alimento elevar su nivel de azcar en la sangre. Elija los alimentos con bajo IG. Estos tardan ms tiempo en subir el nivel de azcar en la sangre.  Preste mucha atencin a la cantidad de hidratos de carbono que hay en los alimentos que consume. Los hidratos de carbono aumentan los niveles de azcar en la sangre.  Lleve un registro de la cantidad de caloras que ingiere. Ingerir la cantidad correcta de caloras lo ayudar a alcanzar un peso saludable. Bajar alrededor del 7por ciento del peso inicial puede ayudar a evitar la diabetes tipo2.  Tal vez deba seguir una dieta mediterrnea. Esta incluye una gran cantidad de verduras, carnes magras o pescado, cereales integrales, frutas, as como aceites y grasas saludables.  QU ALIMENTOS PUEDO COMER? Cereales Cereales integrales, como panes, galletas, cereales y pastas de salvado o integrales. Avena sin azcar. Trigo burgol. Cebada. Quinua. Arroz integral. Tortillas o tacos de harina de maz o de salvado. Verduras Lechuga. Espinaca. Guisantes. Remolachas. Coliflor. Repollo. Brcoli. Zanahorias. Tomates. Calabaza. Berenjena. Hierbas.  Pimientos. Cebollas. Pepinos. Repollitos de Bruselas. Frutas Frutos rojos. Bananas. Manzanas. Naranjas. Uvas. Papaya. Mango. Granada. Kiwi. Pomelo. Cerezas. Carnes y otras fuentes de protenas Mariscos. Carnes magras, entre ellas, pollo y pavo o cortes magros de carne de cerdo y de vaca. Tofu. Huevos. Los frutos secos. Frijoles. Lcteos Productos lcteos descremados o semidescremados, como yogur, queso cottage y queso. Bebidas Agua. T. Caf. Gaseosas sin azcar o dietticas. Agua de Seltz. Leche. Productos alternativos de la leche, como leche de soja o de almendra. Condimentos Mostaza. Salsa de pepinillos. Ktchup con bajo contenido de grasa y de azcar. Salsa barbacoa con bajo contenido de grasa y de azcar. Mayonesa sin grasa o con bajo contenido de grasa. Dulces y postres Budines sin azcar o con bajo contenido de grasa. Helados y otros dulces congelados sin azcar o con bajo contenido de grasa. Grasas y aceites [place]. Nueces. Aceite de oliva. Los artculos mencionados arriba pueden no ser una lista completa de las bebidas o los alimentos recomendados. Comunquese con el nutricionista para conocer ms opciones. QU ALIMENTOS NO SE RECOMIENDAN? Cereales Productos a base de harina y de harina blanca refinada, como panes, pastas, bocadillos y cereales. Bebidas Bebidas azucaradas, como t helado y gaseosas con azcar. Dulces y postres Productos de panadera, como tortas, madalenas, pasteles, galletitas y tarta de queso. Los artculos mencionados arriba pueden no ser una lista completa de las bebidas y los alimentos que se deben evitar. Comunquese con el nutricionista para obtener ms informacin. Esta informacin no tiene como fin reemplazar el consejo del mdico. Asegrese de hacerle al mdico cualquier pregunta que tenga. Document Released: 05/29/2015 Document Revised: 05/29/2015 Document Reviewed: 10/03/2014 Elsevier   Interactive Patient Education  2017 Elsevier Inc.  

## 2018-08-24 NOTE — Progress Notes (Signed)
Subjective:  Patient ID: Emma Beard, female    DOB: 06-22-1976  Age: 42 y.o. MRN: [id number]  CC: annual exam and PAP  HPI Emma Beard is a 42 y.o. female with a medical history of gestational diabetes presents for an annual physical exam and Pap smear. States she is generally feeling well. Does not endorse CP, palpitations, SOB, HA, tingling, numbness, abdominal pain, f/c/n/v, swelling, GI/GU sxs, or dyspareunia.        Outpatient Medications Prior to Visit  Medication Sig Dispense Refill  . cyclobenzaprine (FLEXERIL) 10 MG tablet Take 1 tablet (10 mg total) by mouth at bedtime. (Patient not taking: Reported on 08/24/2018) 10 tablet 0  . naproxen (NAPROSYN) 500 MG tablet Take 1 tablet (500 mg total) by mouth 2 (two) times daily with a meal. (Patient not taking: Reported on 08/24/2018) 20 tablet 0  . omeprazole (PRILOSEC) 40 MG capsule Take 1 capsule (40 mg total) by mouth daily. (Patient not taking: Reported on 08/24/2018) 30 capsule 3   No facility-administered medications prior to visit.      ROS Review of Systems  Constitutional: Negative for chills, fever and malaise/fatigue.  Eyes: Negative for blurred vision.  Respiratory: Negative for shortness of breath.   Cardiovascular: Negative for chest pain and palpitations.  Gastrointestinal: Negative for abdominal pain and nausea.  Genitourinary: Negative for dysuria and hematuria.  Musculoskeletal: Negative for joint pain and myalgias.  Skin: Negative for rash.  Neurological: Negative for tingling and headaches.  Psychiatric/Behavioral: Negative for depression. The patient is not nervous/anxious.     Objective:  BP 105/72   Pulse 73   Temp 98.3 F (36.8 C) (Oral)   Resp 16   Ht 5\' 2"  (1.575 m)   Wt 163 lb 9.6 oz (74.2 kg)   SpO2 99%   BMI 29.92 kg/m   BP/Weight 08/24/2018 06/22/2018 12/02/2016  Systolic BP 105 110 121  Diastolic BP 72 75 80  Wt. (Lbs) 163.6 161.2 155.5  BMI 29.92 29.48 28.91       Physical Exam  Constitutional: She is oriented to person, place, and time.  Well developed, well nourished, NAD, polite  HENT:  Head: Normocephalic and atraumatic.  Eyes: No scleral icterus.  Neck: Normal range of motion. Neck supple. No thyromegaly present.  Cardiovascular: Normal rate, regular rhythm and normal heart sounds.  Pulmonary/Chest: Effort normal and breath sounds normal.  Abdominal: Soft. Bowel sounds are normal. There is no tenderness.  Genitourinary:  Genitourinary Comments: Thick white vaginal discharge. Mild cervical motion tenderness. No adnexal tenderness or mass. Symmetric uterus with no TTP. Mild firmness felt centrally on the uterus.   Musculoskeletal: She exhibits no edema.  Neurological: She is alert and oriented to person, place, and time.  Skin: Skin is warm and dry. No rash noted. No erythema. No pallor.  Psychiatric: She has a normal mood and affect. Her behavior is normal. Thought content normal.  Vitals reviewed.    Assessment & Plan:    1. Annual physical exam - Lipid panel - Basic Metabolic Panel - Urinalysis Dipstick  2. Screening for cervical cancer - Cytology - PAP([place])  3. Screening for diabetes mellitus - HgB A1c   4. Uterine mass - POCT urine pregnancy negative - Urinalysis Dipstick with moderate blood.   5. Prediabetes - 6.4% today. - Dietary advise printed for patient.   6. Hematuria - Pt advised to return in 6 weeks for repeat UA only.   Follow-up: 6 months for prediabetes  Loletta Specteroger David Talley Casco  PA

## 2018-08-25 ENCOUNTER — Other Ambulatory Visit (INDEPENDENT_AMBULATORY_CARE_PROVIDER_SITE_OTHER): Payer: Self-pay | Admitting: Physician Assistant

## 2018-08-25 DIAGNOSIS — E7841 Elevated Lipoprotein(a): Secondary | ICD-10-CM

## 2018-08-25 LAB — BASIC METABOLIC PANEL
BUN/Creatinine Ratio: 18 (ref 9–23)
BUN: 10 mg/dL (ref 6–24)
CO2: 22 mmol/L (ref 20–29)
Calcium: 9.1 mg/dL (ref 8.7–10.2)
Chloride: 100 mmol/L (ref 96–106)
Creatinine, Ser: 0.55 mg/dL — ABNORMAL LOW (ref 0.57–1.00)
GFR calc Af Amer: 133 mL/min/{1.73_m2} (ref >59)
GFR, EST NON AFRICAN AMERICAN: 115 mL/min/{1.73_m2} (ref >59)
Glucose: 113 mg/dL — ABNORMAL HIGH (ref 65–99)
Potassium: 4.2 mmol/L (ref 3.5–5.2)
Sodium: 137 mmol/L (ref 134–144)

## 2018-08-25 LAB — CYTOLOGY - PAP
ADEQUACY: ABSENT
Bacterial vaginitis: NEGATIVE
CANDIDA VAGINITIS: NEGATIVE
Chlamydia: NEGATIVE
DIAGNOSIS: NEGATIVE
Neisseria Gonorrhea: NEGATIVE
Trichomonas: NEGATIVE

## 2018-08-25 LAB — LIPID PANEL
Chol/HDL Ratio: 4.1 ratio (ref 0.0–4.4)
Cholesterol, Total: 190 mg/dL (ref 100–199)
HDL: 46 mg/dL (ref >39)
LDL CALC: 124 mg/dL — AB (ref 0–99)
TRIGLYCERIDES: 102 mg/dL (ref 0–149)
VLDL CHOLESTEROL CAL: 20 mg/dL (ref 5–40)

## 2018-08-25 MED ORDER — SIMVASTATIN 20 MG PO TABS: 20.0000 mg | ORAL_TABLET | Freq: Every day | ORAL | 1 refills | Status: DC

## 2018-08-26 ENCOUNTER — Telehealth (INDEPENDENT_AMBULATORY_CARE_PROVIDER_SITE_OTHER): Payer: Self-pay

## 2018-08-26 NOTE — Telephone Encounter (Signed)
Call placed using pacific interpreter [phone]([id number]) a gentle man answered and stated patient was at home and provided an alternate number to reach patient [phone]. Called that number and the voicemail has not been set up yet. Will attempt to call patient at a later time. Maryjean Mornempestt S Anjulie Dipierro, CMA

## 2018-08-26 NOTE — Telephone Encounter (Signed)
-----   Message from Loletta Specteroger David Gomez, PA-C sent at 08/26/2018  8:42 AM EST ----- Pap is completely negative.

## 2018-08-30 ENCOUNTER — Telehealth (INDEPENDENT_AMBULATORY_CARE_PROVIDER_SITE_OTHER): Payer: Self-pay

## 2018-08-30 NOTE — Telephone Encounter (Signed)
-----   Message from Roger David Gomez, PA-C sent at 08/26/2018  8:42 AM EST ----- Pap is completely negative. 

## 2018-08-30 NOTE — Telephone Encounter (Signed)
Call placed using pacific interpreter Linedjs([id number]) patient aware that pap is negative and cholesterol elevated. Simvastatin sent to [facility]. Patient expressed understanding. Maryjean Mornempestt S Roberts, CMA

## 2019-02-22 ENCOUNTER — Ambulatory Visit (INDEPENDENT_AMBULATORY_CARE_PROVIDER_SITE_OTHER): Payer: Self-pay | Admitting: Primary Care

## 2019-02-23 ENCOUNTER — Ambulatory Visit (INDEPENDENT_AMBULATORY_CARE_PROVIDER_SITE_OTHER): Payer: Self-pay | Admitting: Primary Care

## 2019-07-13 ENCOUNTER — Encounter (HOSPITAL_COMMUNITY): Payer: Self-pay

## 2019-07-13 ENCOUNTER — Ambulatory Visit (HOSPITAL_COMMUNITY)
Admission: EM | Admit: 2019-07-13 | Discharge: 2019-07-13 | Disposition: A | Discharge status: Home / Self Care | Payer: Self-pay | Attending: Family Medicine | Admitting: Family Medicine

## 2019-07-13 ENCOUNTER — Other Ambulatory Visit: Payer: Self-pay

## 2019-07-13 DIAGNOSIS — N390 Urinary tract infection, site not specified: Secondary | ICD-10-CM | POA: Insufficient documentation

## 2019-07-13 HISTORY — DX: Hyperlipidemia, unspecified: E78.5

## 2019-07-13 LAB — POCT URINALYSIS DIP (DEVICE)
Bilirubin Urine: NEGATIVE
Glucose, UA: 250 mg/dL — AB
Ketones, ur: NEGATIVE mg/dL
Nitrite: POSITIVE — AB
Protein, ur: 100 mg/dL — AB
Specific Gravity, Urine: 1.02 (ref 1.005–1.030)
Urobilinogen, UA: 0.2 mg/dL (ref 0.0–1.0)
pH: 7 (ref 5.0–8.0)

## 2019-07-13 MED ORDER — SULFAMETHOXAZOLE-TRIMETHOPRIM 800-160 MG PO TABS: 1.0000 | ORAL_TABLET | Freq: Two times a day (BID) | ORAL | 0 refills | Status: AC

## 2019-07-13 MED ORDER — IBUPROFEN 800 MG PO TABS: 800.0000 mg | ORAL_TABLET | Freq: Three times a day (TID) | ORAL | 0 refills | Status: DC

## 2019-07-13 MED ORDER — ONDANSETRON HCL 4 MG PO TABS: 4.0000 mg | ORAL_TABLET | Freq: Four times a day (QID) | ORAL | 0 refills | Status: DC

## 2019-07-13 NOTE — Discharge Instructions (Signed)
Take ibuprofen 3 times a day with food.  This is for pain Take antibiotic 2 times a day.  Take 2 doses today, 1 now and then 1 at bedtime Take Zofran if needed for nausea  It is important that you rest take it off your feet for a couple of days. Return for any worsening pain, fever, vomiting

## 2019-07-13 NOTE — ED Provider Notes (Signed)
[facility]    CSN: [id number] Arrival date & time: 07/13/19  1226      History   Chief Complaint Chief Complaint  Patient presents with  . abd pain, dysuria, back pain    HPI Emma Beard is a 43 y.o. female.   HPI  She has lower abdominal pain.  Some low back pain.  Dysuria.  Frequency.  No fever or chills.  No flank pain.  No nausea vomiting.  No hematuria.  Patient states she is not pregnant.  She thinks she has a bladder infection. She is seen with a Spanish interpreter.  No other issues  Past Medical History:  Diagnosis Date  . Gestational diabetes   . Hyperlipidemia     Patient Active Problem List   Diagnosis Date Noted  . Gallstone pancreatitis 09/18/2011  . Gestational diabetes 09/18/2011    Past Surgical History:  Procedure Laterality Date  . CHOLECYSTECTOMY  09/18/2011   Procedure: LAPAROSCOPIC CHOLECYSTECTOMY WITH INTRAOPERATIVE CHOLANGIOGRAM;  Surgeon: Belva Crome, MD;  Location: [facility];  Service: General;  Laterality: N/A;  . WISDOM TOOTH EXTRACTION  2011    OB History    Gravida  2   Para  2   Term  2   Preterm  0   AB  0   Living  2     SAB  0   TAB  0   Ectopic  0   Multiple      Live Births  2            Home Medications    Prior to Admission medications   Medication Sig Start Date End Date Taking? Authorizing Provider  ibuprofen (ADVIL) 800 MG tablet Take 1 tablet (800 mg total) by mouth 3 (three) times daily. 07/13/19   Raylene Everts, MD  metFORMIN (GLUCOPHAGE-XR) 500 MG 24 hr tablet Take 1 tablet (500 mg total) by mouth daily with breakfast. 08/24/18   Clent Demark, PA-C  ondansetron (ZOFRAN) 4 MG tablet Take 1 tablet (4 mg total) by mouth every 6 (six) hours. 07/13/19   Raylene Everts, MD  simvastatin (ZOCOR) 20 MG tablet Take 1 tablet (20 mg total) by mouth at bedtime. 08/25/18   Clent Demark, PA-C  sulfamethoxazole-trimethoprim (BACTRIM DS) 800-160 MG tablet Take 1  tablet by mouth 2 (two) times daily for 7 days. 07/13/19 07/20/19  Raylene Everts, MD    Family History Family History  Problem Relation Age of Onset  . Diabetes Mother        died from diabetes  . Heart disease Father        died from heart murmur  . Diabetes Brother        died from diabetes    Social History Social History   Tobacco Use  . Smoking status: Never Smoker  . Smokeless tobacco: Never Used  Substance Use Topics  . Alcohol use: No  . Drug use: No     Allergies   Patient has no known allergies.   Review of Systems Review of Systems  Constitutional: Negative for chills and fever.  HENT: Negative for ear pain and sore throat.   Eyes: Negative for pain and visual disturbance.  Respiratory: Negative for cough and shortness of breath.   Cardiovascular: Negative for chest pain and palpitations.  Gastrointestinal: Negative for abdominal pain and vomiting.  Genitourinary: Positive for dysuria, frequency, pelvic pain and urgency. Negative for flank pain, hematuria and vaginal bleeding.  Musculoskeletal: Negative for arthralgias and back pain.  Skin: Negative for color change and rash.  Neurological: Negative for seizures and syncope.  All other systems reviewed and are negative.    Physical Exam Triage Vital Signs ED Triage Vitals  Enc Vitals Group     BP 07/13/19 1303 131/84     Pulse Rate 07/13/19 1303 (!) 103     Resp 07/13/19 1303 16     Temp 07/13/19 1303 99 F (37.2 C)     Temp Source 07/13/19 1303 Oral     SpO2 07/13/19 1303 100 %     Weight --      Height --      Head Circumference --      Peak Flow --      Pain Score 07/13/19 1309 9     Pain Loc --      Pain Edu? --      Excl. in GC? --    No data found.  Updated Vital Signs BP 131/84 (BP Location: Right Arm)   Pulse (!) 103   Temp 99 F (37.2 C) (Oral)   Resp 16   LMP 06/22/2019 (Approximate)   SpO2 100%   Visual Acuity Right Eye Distance:   Left Eye Distance:    Bilateral Distance:    Right Eye Near:   Left Eye Near:    Bilateral Near:     Physical Exam Constitutional:      General: She is not in acute distress.    Appearance: She is well-developed.     Comments: Appears well.  Mildly overweight  HENT:     Head: Normocephalic and atraumatic.     Mouth/Throat:     Comments: Mask is present Eyes:     Conjunctiva/sclera: Conjunctivae normal.     Pupils: Pupils are equal, round, and reactive to light.  Neck:     Musculoskeletal: Normal range of motion.  Cardiovascular:     Rate and Rhythm: Normal rate and regular rhythm.     Heart sounds: Normal heart sounds.  Pulmonary:     Effort: Pulmonary effort is normal. No respiratory distress.     Breath sounds: Normal breath sounds.  Abdominal:     General: Abdomen is flat. There is no distension.     Palpations: Abdomen is soft.     Tenderness: There is no abdominal tenderness. There is no right CVA tenderness or left CVA tenderness.     Comments: No abdominal tenderness.  No CVA tenderness  Musculoskeletal: Normal range of motion.  Skin:    General: Skin is warm and dry.  Neurological:     Mental Status: She is alert.      UC Treatments / Results  Labs (all labs ordered are listed, but only abnormal results are displayed) Labs Reviewed  POCT URINALYSIS DIP (DEVICE) - Abnormal; Notable for the following components:      Result Value   Glucose, UA 250 (*)    Hgb urine dipstick LARGE (*)    Protein, ur 100 (*)    Nitrite POSITIVE (*)    Leukocytes,Ua LARGE (*)    All other components within normal limits  URINE CULTURE    EKG   Radiology No results found.  Procedures Procedures (including critical care time)  Medications Ordered in UC Medications - No data to display  Initial Impression / Assessment and Plan / UC Course  I have reviewed the triage vital signs and the nursing notes.  Pertinent labs & imaging  results that were available during my care of the patient  were reviewed by me and considered in my medical decision making (see chart for details).     Patient with cystitis.  Discussed treatment. Final Clinical Impressions(s) / UC Diagnoses   Final diagnoses:  Lower urinary tract infection, acute     Discharge Instructions     Take ibuprofen 3 times a day with food.  This is for pain Take antibiotic 2 times a day.  Take 2 doses today, 1 now and then 1 at bedtime Take Zofran if needed for nausea  It is important that you rest take it off your feet for a couple of days. Return for any worsening pain, fever, vomiting   ED Prescriptions    Medication Sig Dispense Auth. Provider   ondansetron (ZOFRAN) 4 MG tablet Take 1 tablet (4 mg total) by mouth every 6 (six) hours. 12 tablet Eustace MooreNelson, Sabino Denning Sue, MD   ibuprofen (ADVIL) 800 MG tablet Take 1 tablet (800 mg total) by mouth 3 (three) times daily. 21 tablet Eustace MooreNelson, Kenlea Woodell Sue, MD   sulfamethoxazole-trimethoprim (BACTRIM DS) 800-160 MG tablet Take 1 tablet by mouth 2 (two) times daily for 7 days. 14 tablet Eustace MooreNelson, Oneill Bais Sue, MD     PDMP not reviewed this encounter.   Eustace MooreNelson, Sabastion Hrdlicka Sue, MD 07/13/19 [id number]

## 2019-07-13 NOTE — ED Triage Notes (Signed)
Pt presents to UC c/o pain in lower back, pelvic area, and burning w/ urination x2 weeks.

## 2019-07-15 LAB — URINE CULTURE: Culture: >=100000 — AB

## 2019-07-17 ENCOUNTER — Telehealth (HOSPITAL_COMMUNITY): Payer: Self-pay | Admitting: Emergency Medicine

## 2019-07-17 NOTE — Telephone Encounter (Signed)
Urine culture was positive for e coli and was given bactrim  at urgent care visit.. Attempted to reach patient. No answer at this time.   

## 2019-07-19 ENCOUNTER — Ambulatory Visit (INDEPENDENT_AMBULATORY_CARE_PROVIDER_SITE_OTHER): Payer: Self-pay | Admitting: Primary Care

## 2019-07-19 ENCOUNTER — Encounter

## 2020-11-20 ENCOUNTER — Encounter (INDEPENDENT_AMBULATORY_CARE_PROVIDER_SITE_OTHER): Payer: Self-pay | Admitting: Primary Care

## 2020-11-26 ENCOUNTER — Encounter (INDEPENDENT_AMBULATORY_CARE_PROVIDER_SITE_OTHER): Payer: Self-pay | Admitting: Primary Care

## 2020-11-26 ENCOUNTER — Other Ambulatory Visit: Payer: Self-pay

## 2020-11-26 ENCOUNTER — Ambulatory Visit (INDEPENDENT_AMBULATORY_CARE_PROVIDER_SITE_OTHER): Payer: Self-pay | Admitting: Primary Care

## 2020-11-26 VITALS — BP 142/89 | HR 88 | Temp 97.7°F | Ht 62.0 in | Wt 161.8 lb

## 2020-11-26 DIAGNOSIS — Z131 Encounter for screening for diabetes mellitus: Secondary | ICD-10-CM

## 2020-11-26 DIAGNOSIS — E782 Mixed hyperlipidemia: Secondary | ICD-10-CM

## 2020-11-26 DIAGNOSIS — E119 Type 2 diabetes mellitus without complications: Secondary | ICD-10-CM

## 2020-11-26 DIAGNOSIS — I10 Essential (primary) hypertension: Secondary | ICD-10-CM

## 2020-11-26 LAB — POCT GLYCOSYLATED HEMOGLOBIN (HGB A1C): Hemoglobin A1C: 7.7 % — AB (ref 4.0–5.6)

## 2020-11-26 MED ORDER — GLIPIZIDE 10 MG PO TABS: 10.0000 mg | ORAL_TABLET | Freq: Two times a day (BID) | ORAL | 3 refills | Status: DC

## 2020-11-26 MED ORDER — LISINOPRIL 20 MG PO TABS: 20.0000 mg | ORAL_TABLET | Freq: Every day | ORAL | 3 refills | Status: DC

## 2020-11-26 MED ORDER — METFORMIN HCL 1000 MG PO TABS: 1000.0000 mg | ORAL_TABLET | Freq: Two times a day (BID) | ORAL | 3 refills | Status: DC

## 2020-11-26 NOTE — Patient Instructions (Addendum)
Gripe en los adultos Influenza, Adult A la gripe tambin se la conoce como "influenza". Es una [employer], la nariz y la garganta (vas respiratorias). Se transmite fcilmente de persona a persona (es contagiosa). La gripe causa sntomas que son [employer] de un resfro, junto con fiebre alta y dolores corporales. Cules son las causas? La causa de esta afeccin es el virus de la influenza. Puede contraer el virus de las siguientes maneras:  Al inhalar gotitas que quedan en el aire despus de que una persona infectada con gripe tosi o estornud.  Al tocar algo que est contaminado con el virus y [employer] mano a la boca, la nariz o los ojos. Qu incrementa el riesgo? Hay ciertas cosas que lo pueden hacer ms propenso a Nurse, adult. Estas incluyen lo siguiente:  No lavarse las manos con frecuencia.  Tener contacto cercano con [employer] durante la temporada de resfro y gripe.  Tocarse la boca, los ojos o la nariz sin antes lavarse las manos.  No recibir la [employer]. Puede correr un mayor riesgo de [place] gripe, y [place] graves, como una infeccin pulmonar (neumona), si usted:  Es mayor de 7 aos de edad.  Est embarazada.  Tiene debilitado el sistema que combate las defensas (sistema inmunitario) debido a una enfermedad o a que toma determinados medicamentos.  Tiene una afeccin a largo plazo (crnica), como las siguientes: ? Enfermedad cardaca, renal o pulmonar. ? Diabetes. ? Asma.  Tiene un trastorno heptico.  Tiene mucho sobrepeso (obesidad [place]).  Tiene anemia. Cules son los signos o sntomas? Los sntomas normalmente comienzan de repente y Sonda Primes [address] y [address] Pueden incluir los siguientes:  Cristy Hilts y Diller.  Dolores de [place], dolores en el cuerpo o dolores musculares.  Dolor de Investment banker, operational.  Tos.  Secrecin o congestin nasal.  Molestias en el pecho.  No querer comer tanto como lo hace  normalmente.  Sensacin de debilidad o cansancio.  Mareos.  Malestar estomacal o vmitos. Cmo se trata? Si la gripe se detecta de forma temprana, puede recibir tratamiento con medicamentos antivirales. Esto puede ayudar a reducir la gravedad y la duracin de la enfermedad. Se los administrarn por boca o a travs de un tubo (catter) intravenoso. Cuidarse en su hogar puede ayudar a que mejoren los sntomas. El mdico puede recomendarle lo siguiente:  Tomar medicamentos de Radio broadcast assistant.  Beber abundante lquido. La gripe suele desaparecer sola. Si tiene sntomas muy graves u otros problemas, puede recibir tratamiento en un hospital. Siga estas instrucciones en su casa: Actividad  Descanse todo lo que sea necesario. Duerma lo suficiente.  Foy [place] en su casa y no concurra al [place] o a la escuela, como se lo haya indicado el mdico. ? No salga de su casa hasta que no haya tenido fiebre por 24horas sin tomar medicamentos. ? Salga de su casa solamente para ir al [employer]. Comida y bebida  Wyatt Haste SRO (solucin de rehidratacin oral). Es Ardelia Mems bebida que se vende en farmacias y tiendas.  Beba suficiente lquido como para Theatre manager la orina de color amarillo plido.  En la medida en que pueda, beba lquidos transparentes en pequeas cantidades. Los lquidos transparentes son, por ejemplo: ? Grayce Sessions. ? Trocitos de hielo. ? Jugo de frutas mezclado con agua. ? Bebidas deportivas de bajas caloras.  Coma alimentos suaves que sean fciles de digerir. En la medida que pueda, consuma pequeas cantidades. Estos alimentos incluyen: ? Bananas. ? Pur de [employer]. ?  Arroz. Driscilla Grammes. ? Tostadas. ? Galletas.  No coma ni beba lo siguiente: ? Lquidos con alto contenido de azcar o cafena. ? Alcohol. ? Alimentos condimentados o con alto contenido de [place] Indicaciones generales  Use los medicamentos de venta libre y los recetados solamente como se lo haya indicado el mdico.  Use un  humidificador de aire fro para que el aire de su casa est ms hmedo. Esto puede facilitar la respiracin. ? Cuando utilice un humidificador de vapor fro, lmpielo a diario. Vace el agua y [place] por agua limpia.  Al toser o estornudar, cbrase la boca y la [place].  Lvese las manos frecuentemente con agua y [place] y durante al menos 20 segundos. Esto tambin es importante despus de toser o Engineering geologist. Si no dispone de [place] y [place], use desinfectante para manos con alcohol.  Cumpla con todas las visitas de seguimiento.      Cmo se previene?  Colquese la vacuna antigripal todos los [place]. Puede colocarse la vacuna contra la gripe a fines de verano, en otoo o en invierno. Pregntele al mdico cundo debe aplicarse la vacuna contra la gripe.  Evite el contacto con personas que estn enfermas durante el otoo y el invierno. Es la temporada del resfro y Emergency planning/management officer.   Comunquese con un mdico si:  Tiene sntomas nuevos.  Tiene los siguientes sntomas: ? Dolor de Hotel manager. ? Materia fecal lquida (diarrea). ? Fiebre.  La tos empeora.  Empieza a tener ms mucosidad.  Tiene Programme researcher, broadcasting/film/video.  Vomita. Solicite ayuda de inmediato si:  Le falta el aire.  Tiene dificultad para respirar.  La piel o las uas se ponen de un color azulado.  Presenta dolor muy intenso o rigidez en el cuello.  Tiene dolor de cabeza repentino.  Le duele la cara o el odo de forma repentina.  No puede comer ni beber sin vomitar. Estos sntomas pueden representar un problema grave que constituye Radio broadcast assistant. Solicite atencin mdica de inmediato. Comunquese con el servicio de emergencias de su localidad (911 en los [place]).  No espere a ver si los sntomas desaparecen.  No conduzca por sus propios medios [employer]. Resumen  A la gripe tambin se la conoce como "influenza". Es una [employer] , la nariz y Administrator. Se transmite fcilmente de [place] persona a  otra.  Use los medicamentos de venta libre y los recetados solamente como se lo haya indicado el mdico.  Aplicarse la vacuna contra la gripe todos los aos es la mejor manera de no contagiarse la gripe. Esta informacin no tiene Theme park manager el consejo del mdico. Asegrese de hacerle al mdico cualquier pregunta que tenga. Document Revised: 07/04/2020 Document Reviewed: 07/04/2020 Elsevier Patient Education  2021 Elsevier Inc.  Diabetes mellitus y nutricin, en adultos Diabetes Mellitus and Nutrition, Adult Si sufre de diabetes, o diabetes mellitus, es muy importante tener hbitos alimenticios saludables debido a que sus niveles de Psychologist, counselling sangre (glucosa) se ven afectados en gran medida por lo que come y bebe. [place] alimentos saludables en las cantidades correctas, aproximadamente a la misma hora todos los [place], [place] ayudar a:  Scientist, physiological glucemia.  Disminuir el riesgo de sufrir una enfermedad cardaca.  Mejorar la presin arterial.  Barista o mantener un peso saludable. Qu puede afectar mi plan de alimentacin? Todas las personas que sufren de diabetes son diferentes y cada una tiene necesidades diferentes en cuanto a un plan de alimentacin. El mdico puede recomendarle que  trabaje con un nutricionista para elaborar el mejor plan para usted. Su plan de alimentacin puede variar segn factores como:  Las caloras que necesita.  Los medicamentos que toma.  Su peso.  Sus niveles de glucemia, presin arterial y colesterol.  Su nivel de [place].  Otras afecciones que tenga, como enfermedades cardacas o renales. Cmo me afectan los carbohidratos? Los carbohidratos, o hidratos de carbono, afectan su nivel de glucemia ms que cualquier otro tipo de alimento. La ingesta de carbohidratos naturalmente aumenta la cantidad de [employer]. El recuento de carbohidratos es un mtodo destinado a Midwife un registro de la cantidad de carbohidratos que se consumen.  El recuento de carbohidratos es importante para Pharmacologist la glucemia a un nivel saludable, especialmente si utiliza insulina o toma determinados medicamentos por va oral para la diabetes. Es importante conocer la cantidad de carbohidratos que se pueden ingerir en cada comida sin correr Surveyor, minerals. Esto es Government social research officer. Su nutricionista puede ayudarlo a calcular la cantidad de carbohidratos que debe ingerir en cada comida y en cada refrigerio. Cmo me afecta el alcohol? El alcohol puede provocar disminuciones sbitas de la glucemia (hipoglucemia), especialmente si utiliza insulina o toma determinados medicamentos por va oral para la diabetes. La hipoglucemia es una afeccin potencialmente mortal. Los sntomas de la hipoglucemia, como somnolencia, mareos y confusin, son similares a los sntomas de haber consumido demasiado alcohol.  No beba alcohol si: ? Su mdico le indica no hacerlo. ? Est embarazada, puede estar embarazada o est tratando de quedar embarazada.  Si bebe alcohol: ? No beba con el estmago vaco. ? Limite la cantidad que bebe:  De 0 a 1 medida por da para las mujeres.  De 0 a 2 medidas por da para los hombres. ? Est atento a la cantidad de alcohol que hay en las bebidas que toma. En los [place], una medida equivale a una botella de cerveza de 12oz ( ), un vaso de vino de 5oz ( ) o un vaso de una bebida alcohlica de alta graduacin de 1oz (71ml). ? Mantngase hidratado bebiendo agua, refrescos dietticos o t helado sin azcar.  Tenga en cuenta que los refrescos comunes, los jugos y otras bebida para Engineer, manufacturing pueden contener mucha azcar y se deben contar como carbohidratos. Consejos para seguir Consulting civil engineer las etiquetas de los alimentos  Comience por leer el tamao de la porcin en la "Informacin nutricional" en las etiquetas de los alimentos envasados y las bebidas. La cantidad de caloras, carbohidratos, grasas y otros  nutrientes mencionados en la etiqueta se basan en una porcin del alimento. Muchos alimentos contienen ms de una porcin por envase.  Verifique la cantidad total de gramos (g) de carbohidratos totales en una porcin. Puede calcular la cantidad de porciones de carbohidratos al dividir el total de carbohidratos por 15. Por ejemplo, si un alimento tiene un total de 30g de carbohidratos totales por porcin, equivale a 2 porciones de carbohidratos.  Verifique la cantidad de gramos (g) de grasas saturadas y grasas trans de una porcin. Escoja alimentos que no contengan estas grasas o que su contenido de estas sea [place].  Verifique la cantidad de miligramos (mg) de sal (sodio) en una porcin. La mayora de las personas deben limitar la ingesta de sodio total a menos de 2300mg  por .  Siempre consulte la informacin nutricional de los alimentos etiquetados como "con bajo contenido de grasa" o "sin grasa". Estos alimentos pueden tener un mayor contenido de Futures trader o carbohidratos  refinados, y deben evitarse.  Hable con su nutricionista para identificar sus objetivos diarios en cuanto a los nutrientes mencionados en la etiqueta. Al ir de compras  Evite comprar alimentos procesados, enlatados o precocidos. Estos alimentos tienden a Counselling psychologisttener una mayor cantidad de [place]grasa, sodio y azcar agregada.  Compre en la zona exterior de la tienda de comestibles. Esta es la zona donde se encuentran con mayor frecuencia las frutas y las verduras frescas, los cereales a granel, las carnes frescas y los productos lcteos frescos. Al cocinar  Utilice mtodos de coccin a baja temperatura, como hornear, en lugar de mtodos de coccin a alta temperatura, como frer en abundante aceite.  Cocine con aceites saludables, como el aceite de [place] Islandoliva, canola o Wildomargirasol.  Evite cocinar con manteca, crema o carnes con alto contenido de grasa. Planificacin de las comidas  Coma las comidas y los refrigerios regularmente,  preferentemente a la misma hora todos [place]los das. Evite pasar largos perodos de tiempo sin comer.  Consuma alimentos ricos en fibra, como frutas frescas, verduras, frijoles y cereales integrales. Consulte a su nutricionista sobre cuntas porciones de carbohidratos puede consumir en cada comida.  Consuma entre 4 y 6 onzas (entre 112 y 168g) de protenas magras por da, como carnes magras, pollo, pescado, huevos o tofu. Una onza (oz) de protena magra equivale a: ? 1 onza (28g) de carne, pollo o pescado. ? 1huevo. ?  de taza (62 g) de tofu.  Coma algunos alimentos por da que contengan grasas saludables, como aguacates, frutos secos, semillas y pescado.   Qu alimentos debo comer? Nils PyleFrutas Bayas. Manzanas. Naranjas. Duraznos. Damascos. Ciruelas. Uvas. Mango. Papaya. Gran[place]. Kiwi. Cerezas. Hoover BrunetteVerduras Deatra JamesLechuga. Espinaca. Verduras de Marriotthoja verde, que incluyen col rizada, [place] Parkacelga, hojas de Saint Martinberza y de [place] Pointmostaza. Remolachas. Coliflor. Repollo. Brcoli. Zanahorias. Judas verdes. Tomates. Pimientos. Cebollas. Pepinos. Coles de Bruselas. Granos Granos integrales, como panes, galletas, tortillas, cereales y pastas de salvado o integrales. Avena sin azcar. Quinua. Arroz integral o salvaje. Carnes y Warehouse managerotras protenas Mariscos. Carne de ave sin piel. Cortes magros de ave y carne de res. Tofu. Frutos secos. Semillas. Lcteos Productos lcteos sin grasa o con bajo contenido de [place]grasa, Williamsburgcomo leche, yogur y [place]queso. Es posible que los productos que se enumeran ms Seychellesarriba no constituyan una lista completa de los alimentos y las bebidas que puede tomar. Consulte a un nutricionista para obtener ms informacin. Qu alimentos debo evitar? Nils PyleFrutas Frutas enlatadas al almbar. Verduras Verduras enlatadas. Verduras congeladas con mantequilla o salsa de crema. Granos Productos elaborados con [place] y Madagascarharina blanca refinada, como panes, pastas, bocadillos y cereales. Evite todos los alimentos procesados. Carnes y [address]otras  protenas Cortes de carne con alto contenido de Holiday representativegrasa. Carne de ave con piel. Carnes empanizadas o fritas. Carne procesada. Evite las grasas saturadas. Lcteos Yogur, queso o Cardinal Healthleche enteros. Bebidas Bebidas azucaradas, como gaseosas o t helado. Es posible que los productos que se enumeran ms Seychellesarriba no constituyan una lista completa de los alimentos y las bebidas que Personnel officerdebe evitar. Consulte a un nutricionista para obtener ms informacin. Preguntas para hacerle al mdico  Es necesario que me rena con IT trainerun instructor en el cuidado de la diabetes?  Es necesario que me rena con un nutricionista?  A qu nmero puedo llamar si tengo preguntas?  Cules son los mejores momentos para controlar la glucemia? Dnde encontrar ms informacin:  Asociacin Estadounidense de la Diabetes (American Diabetes Association): diabetes.org  Academy of Nutrition and Dietetics (Academia de Nutricin y Pension scheme managerDiettica): www.eatright.[employer] Steel Holding Corporationorg  National Institute of  Diabetes and Digestive and Kidney Diseases [employer] de la Diabetes y las Enfermedades Digestivas y Renales): CarFlippers.tn  Association of Diabetes Care and Education Specialists (Asociacin de Especialistas en Atencin y Educacin sobre la Diabetes): www.diabeteseducator.org Resumen  Es importante tener hbitos alimenticios saludables debido a que sus niveles de Psychologist, counselling sangre (glucosa) se ven afectados en gran medida por lo que come y bebe.  Un plan de alimentacin saludable lo ayudar a controlar la glucemia y Pharmacologist un estilo de vida saludable.  El mdico puede recomendarle que trabaje con un nutricionista para elaborar el mejor plan para usted.  Tenga en cuenta que los carbohidratos (hidratos de carbono) y el alcohol tienen efectos inmediatos en sus niveles de glucemia. Es importante contar los carbohidratos que ingiere y consumir alcohol con prudencia. Esta informacin no tiene Theme park manager el consejo del mdico. Asegrese  de hacerle al mdico cualquier pregunta que tenga. Document Revised: 10/12/2019 Document Reviewed: 10/12/2019 Elsevier Patient Education  2021 [employer].

## 2020-11-26 NOTE — Progress Notes (Signed)
Leta [facility]ling   Established Patient Office Visit  Subjective:  Patient ID: Emma Beard, female    DOB: [birth year]  Age: 45 y.o. MRN: [id number]  CC:  Chief Complaint  Patient presents with  . Establish Care    New Dx of diabetes     HPI [name] is a 45 year old Hispanic female (interputor Claudia Desanctis [phone] ) presents for establish care and diabetic screening down new dx of Type 2 diabetes. DIABETES Hypoglycemic episodes:no Polydipsia/polyuria: no Visual disturbance: no Chest pain: no Paresthesias: no Polyphagia yes Blood pressure is elevated   Past Medical History:  Diagnosis Date  . Gestational diabetes   . Hyperlipidemia     Past Surgical History:  Procedure Laterality Date  . CHOLECYSTECTOMY  09/18/2011   Procedure: LAPAROSCOPIC CHOLECYSTECTOMY WITH INTRAOPERATIVE CHOLANGIOGRAM;  Surgeon: Belva Crome, MD;  Location: [facility];  Service: General;  Laterality: N/A;  . WISDOM TOOTH EXTRACTION  2011    Family History  Problem Relation Age of Onset  . Diabetes Mother        died from diabetes  . Heart disease Father        died from heart murmur  . Diabetes Brother        died from diabetes    Social History   Socioeconomic History  . Marital status: Married    Spouse name: Not on file  . Number of children: Not on file  . Years of education: Not on file  . Highest education level: Not on file  Occupational History  . Not on file  Tobacco Use  . Smoking status: Never Smoker  . Smokeless tobacco: Never Used  Substance and Sexual Activity  . Alcohol use: No  . Drug use: No  . Sexual activity: Yes    Birth control/protection: None    Comment: 3 mo post partum  Other Topics Concern  . Not on file  Social History Narrative   ** Merged History Encounter **       Social Determinants of Health   Financial Resource Strain: Not on file  Food Insecurity: Not on file  Transportation Needs: Not on file  Physical Activity: Not on  file  Stress: Not on file  Social Connections: Not on file  Intimate Partner Violence: Not on file    Outpatient Medications Prior to Visit  Medication Sig Dispense Refill  . simvastatin (ZOCOR) 20 MG tablet Take 1 tablet (20 mg total) by mouth at bedtime. (Patient not taking: Reported on 11/26/2020) 90 tablet 1  . ibuprofen (ADVIL) 800 MG tablet Take 1 tablet (800 mg total) by mouth 3 (three) times daily. 21 tablet 0  . metFORMIN (GLUCOPHAGE-XR) 500 MG 24 hr tablet Take 1 tablet (500 mg total) by mouth daily with breakfast. (Patient not taking: Reported on 11/26/2020) 30 tablet 5  . ondansetron (ZOFRAN) 4 MG tablet Take 1 tablet (4 mg total) by mouth every 6 (six) hours. 12 tablet 0   No facility-administered medications prior to visit.    No Known Allergies  ROS Review of Systems Pertinent positive and negative noted in HPI   Objective:    Physical Exam Vitals reviewed.  HENT:     Head: Normocephalic.     Right Ear: Tympanic membrane and external ear normal.     Left Ear: Tympanic membrane and external ear normal.     Nose: Nose normal.  Eyes:     Extraocular Movements: Extraocular movements intact.     Pupils: Pupils are  equal, round, and reactive to light.  Cardiovascular:     Rate and Rhythm: Normal rate and regular rhythm.  Pulmonary:     Effort: Pulmonary effort is normal.     Breath sounds: Normal breath sounds.  Abdominal:     General: Abdomen is flat. There is distension.     Palpations: Abdomen is soft.  Musculoskeletal:        General: Normal range of motion.     Cervical back: Normal range of motion.  Skin:    General: Skin is warm and dry.  Neurological:     Mental Status: She is alert and oriented to person, place, and time.  Psychiatric:        Mood and Affect: Mood normal.        Behavior: Behavior normal.        Thought Content: Thought content normal.        Judgment: Judgment normal.     BP (!) 142/89 (BP Location: Right Arm, Patient Position:  Sitting, Cuff Size: Normal)   Pulse 88   Temp 97.7 F (36.5 C) (Temporal)   Ht 5' 2"  (1.575 m)   Wt 161 lb 12.8 oz (73.4 kg)   LMP 11/16/2020 (Exact Date)   SpO2 97%   BMI 29.59 kg/m  Wt Readings from Last 3 Encounters:  11/26/20 161 lb 12.8 oz (73.4 kg)  08/24/18 163 lb 9.6 oz (74.2 kg)  06/22/18 161 lb 3.2 oz (73.1 kg)     Health Maintenance Due  Topic Date Due  . Hepatitis C Screening  Never done  . COVID-19 Vaccine (1) Never done  . URINE MICROALBUMIN  Never done  . INFLUENZA VACCINE  04/21/2020  . COLONOSCOPY (Pts 45-81yr Insurance coverage will need to be confirmed)  Never done    There are no preventive care reminders to display for this patient.  No results found for: TSH Lab Results  Component Value Date   WBC 8.5 12/02/2016   HGB 14.2 12/02/2016   HCT 42.6 12/02/2016   MCV 86 12/02/2016   PLT 235 12/02/2016   Lab Results  Component Value Date   NA 137 08/24/2018   K 4.2 08/24/2018   CO2 22 08/24/2018   GLUCOSE 113 (H) 08/24/2018   BUN 10 08/24/2018   CREATININE 0.55 (L) 08/24/2018   BILITOT 0.3 12/02/2016   ALKPHOS 62 12/02/2016   AST 17 12/02/2016   ALT 20 12/02/2016   PROT 7.3 12/02/2016   ALBUMIN 4.5 12/02/2016   CALCIUM 9.1 08/24/2018   Lab Results  Component Value Date   CHOL 190 08/24/2018   Lab Results  Component Value Date   HDL 46 08/24/2018   Lab Results  Component Value Date   LDLCALC 124 (H) 08/24/2018   Lab Results  Component Value Date   TRIG 102 08/24/2018   Lab Results  Component Value Date   CHOLHDL 4.1 08/24/2018   Lab Results  Component Value Date   HGBA1C 7.7 (A) 11/26/2020      Assessment & Plan:  CPrabhjotwas seen today for establish care.  Diagnoses and all orders for this visit:  Screening for diabetes mellitus -     HgB A1c  Type 2 diabetes mellitus without complication, without long-term current use of insulin (HCC) : Goal of therapy: Less than 6.5 hemoglobin A1c.  Discussed foods that are  high in carbohydrates are the following rice, potatoes, breads, sugars, and pastas.  Reduction in the intake (eating) will assist in lowering your  blood sugars. Diabetic teaching -     CBC with Differential/Platelet; Future -     metFORMIN (GLUCOPHAGE) 1000 MG tablet; Take 1 tablet (1,000 mg total) by mouth 2 (two) times daily with a meal. -     glipiZIDE (GLUCOTROL) 10 MG tablet; Take 1 tablet (10 mg total) by mouth 2 (two) times daily before a meal.  Essential hypertension Counseled on blood pressure goal of less than 130/80, low-sodium, DASH diet, medication compliance, 150 minutes of moderate intensity exercise per week. Discussed medication compliance, adverse effects. -     CMP14+EGFR; Future -     lisinopril (ZESTRIL) 20 MG tablet; Take 1 tablet (20 mg total) by mouth daily.  Mixed hyperlipidemia Hx of return for fasting labs and statin per ADA guidelines -     Lipid panel; Future    Meds ordered this encounter  Medications  . metFORMIN (GLUCOPHAGE) 1000 MG tablet    Sig: Take 1 tablet (1,000 mg total) by mouth 2 (two) times daily with a meal.    Dispense:  180 tablet    Refill:  3  . glipiZIDE (GLUCOTROL) 10 MG tablet    Sig: Take 1 tablet (10 mg total) by mouth 2 (two) times daily before a meal.    Dispense:  60 tablet    Refill:  3  . lisinopril (ZESTRIL) 20 MG tablet    Sig: Take 1 tablet (20 mg total) by mouth daily.    Dispense:  90 tablet    Refill:  3    Follow-up: Return in about 3 months (around 02/26/2021) for fasting labs PCP 3 months DM/HTN.    Kerin Perna, NP

## 2021-02-27 ENCOUNTER — Encounter (INDEPENDENT_AMBULATORY_CARE_PROVIDER_SITE_OTHER): Payer: Self-pay | Admitting: Primary Care

## 2021-02-27 ENCOUNTER — Other Ambulatory Visit: Payer: Self-pay

## 2021-02-27 ENCOUNTER — Ambulatory Visit (INDEPENDENT_AMBULATORY_CARE_PROVIDER_SITE_OTHER): Payer: Self-pay | Admitting: Primary Care

## 2021-02-27 VITALS — BP 119/78 | HR 72 | Temp 97.5°F | Ht 62.0 in | Wt 154.6 lb

## 2021-02-27 DIAGNOSIS — E119 Type 2 diabetes mellitus without complications: Secondary | ICD-10-CM

## 2021-02-27 DIAGNOSIS — Z79899 Other long term (current) drug therapy: Secondary | ICD-10-CM

## 2021-02-27 DIAGNOSIS — Z1211 Encounter for screening for malignant neoplasm of colon: Secondary | ICD-10-CM

## 2021-02-27 DIAGNOSIS — N926 Irregular menstruation, unspecified: Secondary | ICD-10-CM

## 2021-02-27 DIAGNOSIS — I10 Essential (primary) hypertension: Secondary | ICD-10-CM

## 2021-02-27 DIAGNOSIS — E782 Mixed hyperlipidemia: Secondary | ICD-10-CM

## 2021-02-27 LAB — GLUCOSE, POCT (MANUAL RESULT ENTRY): POC Glucose: 148 mg/dl — AB (ref 70–99)

## 2021-02-27 LAB — POCT GLYCOSYLATED HEMOGLOBIN (HGB A1C): Hemoglobin A1C: 7.1 % — AB (ref 4.0–5.6)

## 2021-02-27 MED ORDER — GLIPIZIDE 10 MG PO TABS: 10.0000 mg | ORAL_TABLET | Freq: Two times a day (BID) | ORAL | 1 refills | Status: DC

## 2021-02-27 NOTE — Progress Notes (Signed)
Subjective:  Patient ID: Emma Beard, female    DOB: [birth year]  Age: 45 y.o. MRN: [id number]  CC: Diabetes   HPI Emma Beard is a 45 year old Hispanic female ( interpretor Reyne Dumas [phone])  presents for follow-up of diabetes. Patient does not check blood sugar at home  Compliant with meds - Yes Checking CBGs? No  Fasting avg -   Postprandial average -  Exercising regularly? - No Watching carbohydrate intake? - No Neuropathy ? - No Hypoglycemic events - No  - Recovers with :   Pertinent ROS:  Polyuria - No Polydipsia - No Vision problems - No  Medications as noted below. Taking them regularly without complication/adverse reaction being reported today.   History Emma Beard has a past medical history of Gestational diabetes and Hyperlipidemia.   Emma Beard has a past surgical history that includes Wisdom tooth extraction (2011) and Cholecystectomy (09/18/2011).   Her family history includes Diabetes in her brother and mother; Heart disease in her father.Emma Beard reports that Emma Beard has never smoked. Emma Beard has never used smokeless tobacco. Emma Beard reports that Emma Beard does not drink alcohol and does not use drugs.  Current Outpatient Medications on File Prior to Visit  Medication Sig Dispense Refill   glipiZIDE (GLUCOTROL) 10 MG tablet Take 1 tablet (10 mg total) by mouth 2 (two) times daily before a meal. 60 tablet 3   lisinopril (ZESTRIL) 20 MG tablet Take 1 tablet (20 mg total) by mouth daily. 90 tablet 3   metFORMIN (GLUCOPHAGE) 1000 MG tablet Take 1 tablet (1,000 mg total) by mouth 2 (two) times daily with a meal. 180 tablet 3   simvastatin (ZOCOR) 20 MG tablet Take 1 tablet (20 mg total) by mouth at bedtime. (Patient not taking: No sig reported) 90 tablet 1   No current facility-administered medications on file prior to visit.    ROS Review of Systems  Eyes:  Positive for itching.  Endocrine: Positive for polyphagia.  All other systems reviewed and are  negative.  Objective:  BP 119/78 (BP Location: Right Arm, Patient Position: Sitting, Cuff Size: Normal)   Pulse 72   Temp (!) 97.5 F (36.4 C) (Temporal)   Ht 5\' 2"  (1.575 m)   Wt 154 lb 9.6 oz (70.1 kg)   LMP 02/14/2021 (Exact Date)   SpO2 96%   BMI 28.28 kg/m   BP Readings from Last 3 Encounters:  02/27/21 119/78  11/26/20 (!) 142/89  07/13/19 131/84    Wt Readings from Last 3 Encounters:  02/27/21 154 lb 9.6 oz (70.1 kg)  11/26/20 161 lb 12.8 oz (73.4 kg)  08/24/18 163 lb 9.6 oz (74.2 kg)    Physical Exam Vitals reviewed.  Constitutional:      Appearance: Normal appearance.  HENT:     Head: Normocephalic.     Right Ear: Tympanic membrane and external ear normal.     Left Ear: Tympanic membrane and external ear normal.     Nose: Nose normal.  Eyes:     Extraocular Movements: Extraocular movements intact.     Pupils: Pupils are equal, round, and reactive to light.  Cardiovascular:     Rate and Rhythm: Normal rate and regular rhythm.  Pulmonary:     Effort: Pulmonary effort is normal.     Breath sounds: Normal breath sounds.  Abdominal:     General: Bowel sounds are normal.     Palpations: Abdomen is soft.  Musculoskeletal:        General: Normal range of motion.  Cervical back: Normal range of motion.  Skin:    General: Skin is warm and dry.  Neurological:     Mental Status: Emma Beard is alert and oriented to person, place, and time.  Psychiatric:        Mood and Affect: Mood normal.        Behavior: Behavior normal.        Thought Content: Thought content normal.        Judgment: Judgment normal.    Lab Results  Component Value Date   HGBA1C 7.7 (A) 11/26/2020   HGBA1C 6.4 (A) 08/24/2018    Lab Results  Component Value Date   WBC 8.5 12/02/2016   HGB 14.2 12/02/2016   HCT 42.6 12/02/2016   PLT 235 12/02/2016   GLUCOSE 113 (H) 08/24/2018   CHOL 190 08/24/2018   TRIG 102 08/24/2018   HDL 46 08/24/2018   LDLCALC 124 (H) 08/24/2018   ALT 20  12/02/2016   AST 17 12/02/2016   NA 137 08/24/2018   K 4.2 08/24/2018   CL 100 08/24/2018   CREATININE 0.55 (L) 08/24/2018   BUN 10 08/24/2018   CO2 22 08/24/2018   HGBA1C 7.7 (A) 11/26/2020     Assessment & Plan:   Emma Beard was seen today for diabetes.  Diagnoses and all orders for this visit:  Type 2 diabetes mellitus without complication, without long-term current use of insulin (HCC) -     HgB A1c 7.1 from 7.7 improving no change medications  -     Glucose (CBG) 148  Not at goal of therapy: Less than 6.5 hemoglobin A1c. Continue to monitor foods that are high in carbohydrates are the following rice, potatoes, breads, sugars, tortialls and pastas.  Reduction in the intake (eating) will assist in lowering your blood sugars.     Mixed hyperlipidemia Continue to decrease your fatty foods, red meat, cheese, milk and increase fiber like whole grains and veggies. Currently not taking simvastatin 20mg  . Discussed can reduce risk of CVD, stop taking because did not know what her levels were . Today lipids will be done and will advise from there.  Essential hypertension  Well controlled on lisinopril 20mg  daily. Blood pressure is at goal of less than 130/80, continues to monitor low-sodium, DASH diet, medication compliance, 150 minutes of moderate intensity exercise per week.  Colon cancer screening FOBT    Irregular menstrual cycle CBC - no birth control   I am having Emma Beard maintain her simvastatin, metFORMIN, glipiZIDE, and lisinopril.  No orders of the defined types were placed in this encounter.    Follow-up:   No follow-ups on file.  The above assessment and management plan was discussed with the patient. The patient verbalized understanding of and has agreed to the management plan. Patient is aware to call the clinic if symptoms fail to improve or worsen. Patient is aware when to return to the clinic for a follow-up visit. Patient educated on when it is  appropriate to go to the emergency department.   , NP-C

## 2021-02-27 NOTE — Patient Instructions (Signed)
 Diabetes mellitus y actividad fsica Diabetes Mellitus and Exercise Hacer actividad fsica habitualmente es importante para el estado de salud general, en especial para las personas que tienen diabetes mellitus. La actividad fsica no solo se reduce a bajar de peso. Aporta muchos beneficios para la salud, como aumento de la fuerza muscular y la densidad sea, y reduccin de las grasas corporales y el estrs. Esto mejora el estado fsico, la flexibilidad y la resistencia, y todo ello redunda en un mejor estado de salud general. Cules son los beneficios de la actividad fsica si tengo diabetes? La actividad fsica tiene muchos beneficios para las personas con diabetes. Incluyen los siguientes:  Ayuda a bajar y mantener el azcar en la sangre (glucosa) bajo control.  Mejora la respuesta del cuerpo a la hormona insulina porque optimiza la sensibilidad a la insulina.  Reduce la cantidad de insulina que el cuerpo necesita.  Reduce el riesgo de tener una enfermedad cardaca porque: ? Baja los niveles de colesterol "malo" y triglicridos. ? Aumenta los niveles de colesterol "bueno". ? Baja la presin arterial. ? Disminuye la glucemia. Cul es mi plan de actividad? El mdico o un educador para la diabetes certificado pueden ayudarlo a elaborar un plan respecto del tipo y de la frecuencia de actividad fsica adecuado para usted. Esto se denomina "plan de actividad". Asegrese de lo siguiente:  Haga por lo menos 150minutos semanales de ejercicios de intensidad media o alta. Los ejercicios pueden incluir caminar a paso rpido, andar en bicicleta o hacer gimnasia aerbica en el agua.  Haga ejercicios de elongacin y de fortalecimiento, como yoga o levantamiento de pesas, por lo menos 2veces por semana.  Reparta la actividad en al menos 3das de la semana.  Haga algn tipo de actividad fsica cada da. ? No deje pasar ms de 2das seguidos sin hacer algn tipo de actividad fsica. ? No  permanezca inactivo durante ms de 90minutos seguidos. Tmese descansos frecuentes para caminar o estirarse.  Elija ejercicios o actividades que disfrute. Establezca objetivos realistas.  Comience lentamente y aumente de manera gradual la intensidad de la actividad fsica con el correr del tiempo.   Cmo controlo la diabetes durante la actividad fsica? Controlar su nivel de glucemia  Contrlese la glucemia antes y despus de ejercitarse. Si el nivel de glucemia es: ? 240mg/dl (13.3mmol/l) o ms antes de comenzar a hacer actividad fsica, controle la orina para detectar la presencia de cetonas. Estas son sustancias qumicas producidas por el hgado. Si tiene cetonas en la orina, no haga ejercicio hasta que la glucemia se normalice. ? 100 mg/dl (5.6 mmol/l) o menos, tome una colacin que contenga entre 15 y 20 gramos de carbohidratos. Controle la glucemia 15 minutos despus de la colacin para asegurarse de que el nivel de glucosa est por encima de 100 mg/dl (5.6 mmol/l) antes de comenzar a hacer actividad fsica.  Conozca los sntomas de la glucemia baja (hipoglucemia) y aprenda cmo tratarla. El riesgo de tener hipoglucemia aumenta durante y despus de hacer actividad fsica. Siga estos consejos y las instrucciones del mdico  Tenga una colacin de carbohidratos que sea de accin rpida antes, durante y despus de ejercitarse, a fin de evitar o tratar la hipoglucemia.  Evite inyectarse insulina en las zonas del cuerpo que ejercitar. Por ejemplo, evite inyectarse insulina en: ? Los brazos, cuando est por jugar al tenis. ? Las piernas, cuando est por irse a trotar.  Lleve registros de sus hbitos de actividad fsica. Esto puede ayudarlos a usted y   al mdico a adaptar el plan de control de la diabetes segn sea necesario. Escriba los siguientes datos: ? Los alimentos que consume antes y despus de hacer actividad fsica. ? Los niveles de glucemia antes y despus de hacer  ejercicios. ? El tipo y cantidad de actividad fsica que realiza.  Trabaje con el mdico cuando comience un nuevo tipo de actividad fsica o ejercicio. Es posible que el mdico deba hacer lo siguiente: ? Asegurarse de que la actividad sea segura para usted. ? Ajustar la insulina, los otros medicamentos y los alimentos que usted consume.  Beba mucha agua mientras hace ejercicio. Esto previene la prdida de agua (deshidratacin) y los problemas causados por mucho calor en el cuerpo (golpe de calor).   Dnde buscar ms informacin  American Diabetes Association (Asociacin Estadounidense de la Diabetes): www.diabetes.org Resumen  Hacer actividad fsica habitualmente es importante para el estado de salud general, en especial para las personas que tienen diabetes mellitus.  Hacer actividad fsica tiene muchos beneficios para la salud. Aumenta la fuerza muscular y la densidad sea, y reduce las grasas corporales y el estrs. Tambin disminuye y controla la glucemia.  El mdico o un educador para la diabetes certificado puede ayudarlo a elaborar un plan de actividades respecto del tipo y de la frecuencia de actividad fsica adecuados para usted.  Consulte al mdico para asegurarse de que cualquier actividad nueva sea segura para usted. Tambin trabaje con el mdico para ajustar la insulina, los otros medicamentos y los alimentos que consume. Esta informacin no tiene como fin reemplazar el consejo del mdico. Asegrese de hacerle al mdico cualquier pregunta que tenga. Document Revised: 09/08/2019 Document Reviewed: 09/08/2019 Elsevier Patient Education  2021 Elsevier Inc.  

## 2021-02-28 ENCOUNTER — Other Ambulatory Visit: Payer: Self-pay

## 2021-02-28 DIAGNOSIS — Z1211 Encounter for screening for malignant neoplasm of colon: Secondary | ICD-10-CM

## 2021-03-01 LAB — FECAL OCCULT BLOOD, IMMUNOCHEMICAL: Fecal Occult Bld: NEGATIVE

## 2021-03-07 ENCOUNTER — Encounter (INDEPENDENT_AMBULATORY_CARE_PROVIDER_SITE_OTHER): Payer: Self-pay | Admitting: *Deleted

## 2021-03-07 NOTE — Progress Notes (Signed)
Mailed letter:  Emma Beard,  [employer] de su visita reciente estn de vuelta y sus laboratorios han sido revisados. Todos los [place] de laboratorio son normales o estables. Consulte su MyChart o llame a nuestra oficina si tiene alguna pregunta o inquietud.

## 2021-05-30 ENCOUNTER — Other Ambulatory Visit: Payer: Self-pay

## 2021-05-30 ENCOUNTER — Ambulatory Visit (INDEPENDENT_AMBULATORY_CARE_PROVIDER_SITE_OTHER): Payer: Self-pay | Admitting: Primary Care

## 2021-05-30 ENCOUNTER — Encounter (INDEPENDENT_AMBULATORY_CARE_PROVIDER_SITE_OTHER): Payer: Self-pay | Admitting: Primary Care

## 2021-05-30 VITALS — Ht 63.0 in | Wt 152.0 lb

## 2021-05-30 DIAGNOSIS — E782 Mixed hyperlipidemia: Secondary | ICD-10-CM

## 2021-05-30 DIAGNOSIS — E119 Type 2 diabetes mellitus without complications: Secondary | ICD-10-CM

## 2021-05-30 DIAGNOSIS — Z79899 Other long term (current) drug therapy: Secondary | ICD-10-CM

## 2021-05-30 DIAGNOSIS — Z23 Encounter for immunization: Secondary | ICD-10-CM

## 2021-05-30 DIAGNOSIS — I10 Essential (primary) hypertension: Secondary | ICD-10-CM

## 2021-05-30 LAB — POCT GLYCOSYLATED HEMOGLOBIN (HGB A1C): HbA1c, POC (controlled diabetic range): 7.6 % — AB (ref 0.0–7.0)

## 2021-05-30 LAB — POCT CBG (FASTING - GLUCOSE)-MANUAL ENTRY: Glucose Fasting, POC: 138 mg/dL — AB (ref 70–99)

## 2021-05-30 MED ORDER — GLIPIZIDE 10 MG PO TABS
10.0000 mg | ORAL_TABLET | Freq: Two times a day (BID) | ORAL | 1 refills | Status: DC
  Filled 2021-05-30: qty 60, 30d supply, fill #0

## 2021-05-30 MED ORDER — SITAGLIPTIN PHOSPHATE 100 MG PO TABS: 100.0000 mg | ORAL_TABLET | Freq: Every day | ORAL | 1 refills | Status: DC

## 2021-05-30 MED ORDER — LISINOPRIL 2.5 MG PO TABS
2.5000 mg | ORAL_TABLET | Freq: Every day | ORAL | 1 refills | Status: DC
  Filled 2021-05-30: qty 30, 30d supply, fill #0

## 2021-05-30 MED ORDER — SITAGLIPTIN PHOSPHATE 100 MG PO TABS
100.0000 mg | ORAL_TABLET | Freq: Every day | ORAL | 1 refills | Status: DC
  Filled 2021-05-30: qty 30, 30d supply, fill #0

## 2021-05-30 MED ORDER — METFORMIN HCL 1000 MG PO TABS
1000.0000 mg | ORAL_TABLET | Freq: Two times a day (BID) | ORAL | 3 refills | Status: DC
  Filled 2021-05-30: qty 60, 30d supply, fill #0

## 2021-05-30 NOTE — Patient Instructions (Signed)
Vacuna antigripal (de virus vivos, intranasal): lo que debe saber Influenza (Flu) Vaccine (Live, Intranasal): What You Need to Know 1. Por qu vacunarse? La vacuna antigripal puede prevenir la gripe. La gripe es una enfermedad contagiosa que se disemina en los [place] cada ao, por lo general, Eusebio Me octubre y U[place]. Cualquier persona puede contraer gripe, pero es ms peligrosa para Runner, broadcasting/film/video. Los bebs y los nios pequeos, los [employer] de 65 aos, las Financial planner, as [employer] personas que tienen ciertas enfermedades o cuyo sistema inmunitario est debilitado corren un riesgo mayor de tener complicaciones debido a la gripe. La neumona, la bronquitis, las infecciones de los senos paranasales y las infecciones de odos son ejemplos de complicaciones relacionadas con la gripe. Si tiene una afeccin, por ejemplo, enfermedad cardaca, cncer o diabetes, la gripe puede empeorarla. La gripe puede causar fiebre y escalofros, dolor de garganta, dolores musculares, fatiga, tos, dolor de [place] y secrecin o congestin nasal. Algunas personas pueden tener vmitos y Barnett Hatter, [place] esto es ms frecuente en los nios que en los adultos. En un ao promedio, miles de [employer] [place] debido a la gripe, y muchas ms deben ser hospitalizadas. Cada ao, la vacuna antigripal previene millones de enfermedades y evita visitas al mdico relacionadas con la gripe. 2. Madilyn Fireman antigripal de virus vivos atenuados Los CDC (Centros para el Control y la Prevencin de [place]) recomiendan que todas las personas a Glass blower/designer de los 6 meses de edad se vacunen cada temporada de gripe. Es posible que los nios de 6 meses a 8 aos deban recibir 2 dosis durante la misma temporada de gripe. Todas las dems personas tienen que aplicarse 1 sola dosis cada temporada de gripe. La vacuna antigripal de virus vivos atenuados (llamada "LAIV") es un aerosol nasal que puede aplicarse a las personas que  tienen [place] 2 y 49 aos de edad, excepto a las [place]. La vacuna comienza a surtir Librarian, academic 2 semanas despus de su aplicacin. Hay muchos virus de la gripe, y Estate agent. Cada ao, se elabora una nueva vacuna antigripal para brindar proteccin contra los virus gripales que se cree probablemente causen la enfermedad en la siguiente temporada de gripe. Incluso si la vacuna no es especfica para esos virus, aun as puede brindar cierta proteccin. La vacuna antigripal no causa gripe. La vacuna antigripal puede administrarse al mismo tiempo que otras vacunas. 3. Hable con el mdico Comunquese con la persona que le coloca las vacunas si la persona que la recibe: Es Adult nurse de 2 aos de edad o mayor de 49 aos de edad Est [place].La vacuna antigripal de virus vivos atenuados no se recomienda para las personas embarazadas Ha tenido [place] reaccin alrgica despus de Neomia Dear dosis previa de la vacuna antigripal o tiene alguna alergia grave, potencialmente mortal Es un nio o un adolescente de 2 a 17 aos de edad que est tomando aspirina o productos que contienen aspirina o salicilato Tiene el sistema inmunitario debilitado Tiene entre 2 y 4 aos de edad y tiene asma o antecedentes de sibilancias en los ltimos 12 meses Es mayor de 5 aos de edad y tiene asma Tom medicamentos antivirales contra la gripe en las ltimas 3 semanas Cuida a personas gravemente inmunodeprimidas que necesitan un ambiente protegido Tiene otras afecciones subyacentes que pueden poner a las Theatre manager riesgo de sufrir complicaciones graves relacionadas con la gripe (como enfermedad pulmonar, enfermedad cardaca, enfermedad renal como diabetes, trastornos renales o hepticos, trastornos neurolgicos, neuromusculares o  metablicos) No tiene bazo o tiene un bazo que no funciona [place] un implante coclear [place] una fuga de lquido cefalorraqudeo (una fuga del lquido que rodea el cerebro por  la nariz, la garganta, el odo o algn otro lugar de la cabeza) Tuvo el sndrome de Pension scheme manager dentro de las 6 semanas posteriores a una dosis previa de la vacuna antigripal En algunos casos, es posible que el mdico decida posponer la aplicacin de la vacuna antigripal hasta una visita en el futuro. Para algunos pacientes, un tipo diferente de vacuna antigripal (vacuna contra la gripe inactivada o recombinante) podra ser ms apropiada que la vacuna antigripal de virus vivos atenuados. Las personas que sufren trastornos menores, como un resfro, pueden vacunarse. Las personas que tienen enfermedades moderadas o graves generalmente deben esperar hasta recuperarse para poder recibir la vacuna antigripal. Su mdico puede darle ms informacin. 4. Riesgos de [place] reaccin a la vacuna Despus de recibir la vacuna antigripal de virus vivos atenuados, es posible tener secrecin nasal o congestin nasal, sibilancias y Engineer, mining de [place]. Los vmitos, dolores musculares, [place], dolor de [place] y tos son otros posibles efectos secundarios. Si estos problemas ocurren, generalmente comienzan poco despus de la vacunacin y son leves y [place]. Al igual que con cualquier [employer] Data, existe una probabilidad muy remota de que una vacuna cause una reaccin alrgica grave, otra lesin grave o la muerte. 5. Qu pasa si se presenta un problema grave? Podra producirse una reaccin alrgica despus de que la persona vacunada abandone la clnica. Si observa signos de Runner, broadcasting/film/video grave (ronchas, hinchazn de la cara y la garganta, dificultad para respirar, latidos cardacos acelerados, mareos o debilidad), llame al 9-1-1 y lleve a la persona al hospital ms cercano. Si se presentan otros signos que le preocupan, comunquese con su mdico. Las reacciones adversas deben informarse al Sistema de Informe de Eventos Adversos de Administrator, arts (VAERS). Por lo general, el mdico presenta este informe o puede hacerlo usted  mismo. Visite el sitio web del VAERS en www.vaers.LAgents.no o llame al [phone]. El VAERS es solo para Biomedical engineer, y los miembros de su personal no proporcionan asesoramiento mdico. 6. Programa Nacional de Compensacin de Daos por [employer] [place] de Compensacin de Daos por Administrator, arts (VICP) es un programa federal que fue creado para compensar a las personas que puedan haber sufrido daos al recibir ciertas vacunas. Las Investment banker, operational a presuntas lesiones o muerte debidas a la vacunacin tienen un lmite de tiempo para su presentacin, que puede ser de tan solo [employer]. Visite el sitio web del VICP en SpiritualWord.at o llame al [phone] para obtener ms informacin acerca del programa y de cmo presentar un reclamo. 7. Cmo puedo obtener ms informacin? Pregntele a su mdico. Comunquese con el servicio de salud de su localidad o su estado. Visite el sitio web de Tax inspector) (Administracin de Alimentos y Media planner) para ver los prospectos de las vacunas e informacin adicional en FinderList.no. Comunquese con Building control surveyor for [employer]) (Centros para el Control y la Prevencin de [place]): Llame al [phone] (1-800-CDC-INFO) o Visite el sitio web de los CDC en BiotechRoom.com.cy. Declaracin de informacin sobre la vacuna antigripal de virus vivos atenuados (04/26/2020) Esta informacin no tiene Theme park manager el consejo del mdico. Asegrese de hacerle al mdico cualquier pregunta que tenga. Document Revised: 06/25/2020 Document Reviewed: 06/22/2020 Elsevier Patient Education  2022 [employer].

## 2021-05-30 NOTE — Progress Notes (Signed)
F/u DM Not taking Rx'ed medication

## 2021-05-30 NOTE — Progress Notes (Signed)
[facility]   Subjective:  Patient ID: Emma Beard, female    DOB: 1976-07-27  Age: 45 y.o. MRN: [id number]  CC: Diabetes   HPI [name]  is a 45 year old Hispanic female ( interpretor Phil Dopp [phone]) presents for follow-up of diabetes. Patient does not check blood sugar at home. Patient is noncompliant is not taking any medications for DM, HTN or hyperlipidemia.  Compliant with meds - Yes Checking CBGs? No  Fasting avg -   Postprandial average -  Exercising regularly? - Yes Watching carbohydrate intake? - No Neuropathy ? - No Hypoglycemic events - No  - Recovers with :   Pertinent ROS:  Polyuria - No Polydipsia - No Vision problems - No  Medications as noted below. Taking them regularly without complication/adverse reaction being reported today.   History Emma Beard has a past medical history of Gestational diabetes and Hyperlipidemia.   She has a past surgical history that includes Wisdom tooth extraction (2011) and Cholecystectomy (09/18/2011).   Her family history includes Diabetes in her brother and mother; Heart disease in her father.She reports that she has never smoked. She has never used smokeless tobacco. She reports that she does not drink alcohol and does not use drugs.  Current Outpatient Medications on File Prior to Visit  Medication Sig Dispense Refill   glipiZIDE (GLUCOTROL) 10 MG tablet Take 1 tablet (10 mg total) by mouth 2 (two) times daily before a meal. (Patient not taking: Reported on 05/30/2021) 180 tablet 1   lisinopril (ZESTRIL) 20 MG tablet Take 1 tablet (20 mg total) by mouth daily. (Patient not taking: Reported on 05/30/2021) 90 tablet 3   metFORMIN (GLUCOPHAGE) 1000 MG tablet Take 1 tablet (1,000 mg total) by mouth 2 (two) times daily with a meal. (Patient not taking: Reported on 05/30/2021) 180 tablet 3   simvastatin (ZOCOR) 20 MG tablet Take 1 tablet (20 mg total) by mouth at bedtime. (Patient not taking: No  sig reported) 90 tablet 1   No current facility-administered medications on file prior to visit.    ROS Review of Systems  All other systems reviewed and are negative.  Objective:  Ht 5\' 3"  (1.6 m)   Wt 152 lb (68.9 kg)   LMP 05/18/2021   BMI 26.93 kg/m   BP Readings from Last 3 Encounters:  02/27/21 119/78  11/26/20 (!) 142/89  07/13/19 131/84    Wt Readings from Last 3 Encounters:  05/30/21 152 lb (68.9 kg)  02/27/21 154 lb 9.6 oz (70.1 kg)  11/26/20 161 lb 12.8 oz (73.4 kg)    Physical Exam  General: Vital signs reviewed.  Patient is well-developed and well-nourished, in no acute distress and cooperative with exam.  Head: Normocephalic and atraumatic. Eyes: EOMI, conjunctivae normal, no scleral icterus.  Neck: Supple, trachea midline, normal ROM, no JVD, masses, thyromegaly, or carotid bruit present.  Cardiovascular: RRR, S1 normal, S2 normal, no murmurs, gallops, or rubs. Pulmonary/Chest: Clear to auscultation bilaterally, no wheezes, rales, or rhonchi. Abdominal: Soft, non-tender, non-distended, BS +, no masses, organomegaly, or guarding present.  Musculoskeletal: No joint deformities, erythema, or stiffness, ROM full and nontender. Extremities: No lower extremity edema bilaterally,  pulses symmetric and intact bilaterally. No cyanosis or clubbing. Neurological: A&O x3, Strength is normal and symmetric bilaterally, cranial nerve II-XII are grossly intact, no focal motor deficit, sensory intact to light touch bilaterally.  Skin: Warm, dry and intact. No rashes or erythema. Psychiatric: Normal mood and affect. speech and behavior is normal. Cognition and  memory are normal.   Lab Results  Component Value Date   HGBA1C 7.1 (A) 02/27/2021   HGBA1C 7.7 (A) 11/26/2020   HGBA1C 6.4 (A) 08/24/2018    Lab Results  Component Value Date   WBC 8.5 12/02/2016   HGB 14.2 12/02/2016   HCT 42.6 12/02/2016   PLT 235 12/02/2016   GLUCOSE 113 (H) 08/24/2018   CHOL 190  08/24/2018   TRIG 102 08/24/2018   HDL 46 08/24/2018   LDLCALC 124 (H) 08/24/2018   ALT 20 12/02/2016   AST 17 12/02/2016   NA 137 08/24/2018   K 4.2 08/24/2018   CL 100 08/24/2018   CREATININE 0.55 (L) 08/24/2018   BUN 10 08/24/2018   CO2 22 08/24/2018   HGBA1C 7.1 (A) 02/27/2021     Assessment & Plan:   Emma Beard was seen today for diabetes.  Diagnoses and all orders for this visit:  Type 2 diabetes mellitus without complication, without long-term current use of insulin (HCC) Goal of therapy: Less than 6.5 hemoglobin A1c.  Raise to 7.6 A1C . Admits to not monitoring carb intake. Monitor foods that are high in carbohydrates are the following rice, potatoes, breads, sugars, and pastas.  Reduction in the intake (eating) will assist in lowering your blood sugars. Initially was going to prescribed Trulicity after demonstration she decided she rather have another pill  Medication management -     glipiZIDE (GLUCOTROL) 10 MG tablet; Take 1 tablet (10 mg total) by mouth 2 (two) times daily before a meal. -     lisinopril (ZESTRIL) 2.5 MG tablet; Take 1 tablet (2.5 mg total) by mouth daily. -     metFORMIN (GLUCOPHAGE) 1000 MG tablet; Take 1 tablet (1,000 mg total) by mouth 2 (two) times daily with a meal. -     sitaGLIPtin (JANUVIA) 100 MG tablet; Take 1 tablet (100 mg total) by mouth daily.  Mixed hyperlipidemia  Healthy lifestyle diet of fruits vegetables fish nuts whole grains and low saturated fat . Foods high in cholesterol or liver, fatty meats,cheese, butter avocados, nuts and seeds, chocolate and fried foods.  Essential hypertension Counseled on blood pressure goal of less than 130/80, low-sodium, DASH diet, medication compliance, 150 minutes of moderate intensity exercise per week. Discussed medication compliance, adverse effects.   Influenza vaccine administered  I am having Emma Beard maintain her simvastatin, metFORMIN, lisinopril, and glipiZIDE. Added  Januvia   Other orders -     Discontinue: sitaGLIPtin (JANUVIA) 100 MG tablet; Take 1 tablet (100 mg total) by mouth daily. -     Flu vaccine HIGH DOSE PF (Fluzone High dose)     Follow-up:   No follow-ups on file.  The above assessment and management plan was discussed with the patient. The patient verbalized understanding of and has agreed to the management plan. Patient is aware to call the clinic if symptoms fail to improve or worsen. Patient is aware when to return to the clinic for a follow-up visit. Patient educated on when it is appropriate to go to the emergency department.   Gwinda Passe, NP-C

## 2021-05-31 LAB — CMP14+EGFR
ALT: 21 IU/L (ref 0–32)
AST: 22 IU/L (ref 0–40)
Albumin/Globulin Ratio: 1.8 (ref 1.2–2.2)
Albumin: 4.4 g/dL (ref 3.8–4.8)
Alkaline Phosphatase: 60 IU/L (ref 44–121)
BUN/Creatinine Ratio: 16 (ref 9–23)
BUN: 9 mg/dL (ref 6–24)
Bilirubin Total: 0.4 mg/dL (ref 0.0–1.2)
CO2: 23 mmol/L (ref 20–29)
Calcium: 9 mg/dL (ref 8.7–10.2)
Chloride: 102 mmol/L (ref 96–106)
Creatinine, Ser: 0.57 mg/dL (ref 0.57–1.00)
Globulin, Total: 2.5 g/dL (ref 1.5–4.5)
Glucose: 131 mg/dL — ABNORMAL HIGH (ref 65–99)
Potassium: 4.6 mmol/L (ref 3.5–5.2)
Sodium: 137 mmol/L (ref 134–144)
Total Protein: 6.9 g/dL (ref 6.0–8.5)
eGFR: 114 mL/min/{1.73_m2} (ref >59)

## 2021-05-31 LAB — CBC WITH DIFFERENTIAL/PLATELET
Basophils Absolute: 0 10*3/uL (ref 0.0–0.2)
Basos: 0 %
EOS (ABSOLUTE): 0.2 10*3/uL (ref 0.0–0.4)
Eos: 3 %
Hematocrit: 42.5 % (ref 34.0–46.6)
Hemoglobin: 14 g/dL (ref 11.1–15.9)
Immature Grans (Abs): 0 10*3/uL (ref 0.0–0.1)
Immature Granulocytes: 0 %
Lymphocytes Absolute: 2.5 10*3/uL (ref 0.7–3.1)
Lymphs: 38 %
MCH: 27.3 pg (ref 26.6–33.0)
MCHC: 32.9 g/dL (ref 31.5–35.7)
MCV: 83 fL (ref 79–97)
Monocytes Absolute: 0.5 10*3/uL (ref 0.1–0.9)
Monocytes: 7 %
Neutrophils Absolute: 3.3 10*3/uL (ref 1.4–7.0)
Neutrophils: 52 %
Platelets: 248 10*3/uL (ref 150–450)
RBC: 5.12 x10E6/uL (ref 3.77–5.28)
RDW: 12.8 % (ref 11.7–15.4)
WBC: 6.5 10*3/uL (ref 3.4–10.8)

## 2021-05-31 LAB — LIPID PANEL
Chol/HDL Ratio: 3.8 ratio (ref 0.0–4.4)
Cholesterol, Total: 179 mg/dL (ref 100–199)
HDL: 47 mg/dL (ref >39)
LDL Chol Calc (NIH): 120 mg/dL — ABNORMAL HIGH (ref 0–99)
Triglycerides: 63 mg/dL (ref 0–149)
VLDL Cholesterol Cal: 12 mg/dL (ref 5–40)

## 2021-06-01 ENCOUNTER — Other Ambulatory Visit (INDEPENDENT_AMBULATORY_CARE_PROVIDER_SITE_OTHER): Payer: Self-pay | Admitting: Primary Care

## 2021-06-01 DIAGNOSIS — E7800 Pure hypercholesterolemia, unspecified: Secondary | ICD-10-CM

## 2021-06-01 MED ORDER — PRAVASTATIN SODIUM 20 MG PO TABS: 20.0000 mg | ORAL_TABLET | Freq: Every day | ORAL | 3 refills | Status: DC

## 2021-06-04 ENCOUNTER — Other Ambulatory Visit: Payer: Self-pay

## 2021-08-29 ENCOUNTER — Ambulatory Visit (INDEPENDENT_AMBULATORY_CARE_PROVIDER_SITE_OTHER): Payer: Self-pay | Admitting: Primary Care

## 2021-09-03 ENCOUNTER — Ambulatory Visit (INDEPENDENT_AMBULATORY_CARE_PROVIDER_SITE_OTHER): Payer: Self-pay | Admitting: Primary Care

## 2021-09-03 ENCOUNTER — Encounter (INDEPENDENT_AMBULATORY_CARE_PROVIDER_SITE_OTHER): Payer: Self-pay | Admitting: Primary Care

## 2021-09-03 ENCOUNTER — Other Ambulatory Visit: Payer: Self-pay

## 2021-09-03 VITALS — BP 115/83 | HR 92 | Temp 97.3°F | Ht 61.0 in | Wt 153.6 lb

## 2021-09-03 DIAGNOSIS — Z79899 Other long term (current) drug therapy: Secondary | ICD-10-CM

## 2021-09-03 DIAGNOSIS — E119 Type 2 diabetes mellitus without complications: Secondary | ICD-10-CM

## 2021-09-03 DIAGNOSIS — Z1231 Encounter for screening mammogram for malignant neoplasm of breast: Secondary | ICD-10-CM

## 2021-09-03 DIAGNOSIS — E7800 Pure hypercholesterolemia, unspecified: Secondary | ICD-10-CM

## 2021-09-03 DIAGNOSIS — Z124 Encounter for screening for malignant neoplasm of cervix: Secondary | ICD-10-CM

## 2021-09-03 DIAGNOSIS — Z23 Encounter for immunization: Secondary | ICD-10-CM

## 2021-09-03 LAB — POCT GLYCOSYLATED HEMOGLOBIN (HGB A1C): Hemoglobin A1C: 7 % — AB (ref 4.0–5.6)

## 2021-09-03 MED ORDER — PRAVASTATIN SODIUM 20 MG PO TABS: 20.0000 mg | ORAL_TABLET | Freq: Every day | ORAL | 3 refills | Status: DC

## 2021-09-03 MED ORDER — GLIPIZIDE 10 MG PO TABS: 10.0000 mg | ORAL_TABLET | Freq: Two times a day (BID) | ORAL | 1 refills | Status: DC

## 2021-09-03 MED ORDER — SITAGLIPTIN PHOSPHATE 100 MG PO TABS: 100.0000 mg | ORAL_TABLET | Freq: Every day | ORAL | 2 refills | Status: DC

## 2021-09-03 MED ORDER — METFORMIN HCL 1000 MG PO TABS: 1000.0000 mg | ORAL_TABLET | Freq: Two times a day (BID) | ORAL | 3 refills | Status: DC

## 2021-09-03 MED ORDER — LISINOPRIL 2.5 MG PO TABS: 2.5000 mg | ORAL_TABLET | Freq: Every day | ORAL | 1 refills | Status: DC

## 2021-09-03 NOTE — Patient Instructions (Signed)
Gripe en los adultos Influenza, Adult A la gripe tambin se la conoce como "influenza". Es una infeccin en los pulmones, la nariz y la garganta (vas respiratorias). Se transmite fcilmente de persona a persona (es contagiosa). La gripe causa sntomas que son como los de un resfro, junto con fiebre alta y dolores corporales. Cules son las causas? La causa de esta afeccin es el virus de la influenza. Puede contraer el virus de las siguientes maneras: Al inhalar gotitas que quedan en el aire despus de que una persona infectada con gripe tosi o estornud. Al tocar algo que est contaminado con el virus y luego llevarse la mano a la boca, la nariz o los ojos. Qu incrementa el riesgo? Hay ciertas cosas que lo pueden hacer ms propenso a tener gripe. Estas incluyen lo siguiente: No lavarse las manos con frecuencia. Tener contacto cercano con muchas personas durante la temporada de resfro y gripe. Tocarse la boca, los ojos o la nariz sin antes lavarse las manos. No recibir la vacuna antigripal todos los aos. Puede correr un mayor riesgo de tener gripe, y problemas graves, como una infeccin pulmonar (neumona), si usted: Es mayor de 65 aos de edad. Est embarazada. Tiene debilitado el sistema que combate las defensas (sistema inmunitario) debido a una enfermedad o a que toma determinados medicamentos. Tiene una afeccin a largo plazo (crnica), como las siguientes: Enfermedad cardaca, renal o pulmonar. Diabetes. Asma. Tiene un trastorno heptico. Tiene mucho sobrepeso (obesidad mrbida). Tiene anemia. Cules son los signos o sntomas? Los sntomas normalmente comienzan de repente y duran entre 4 y 14 das. Pueden incluir los siguientes: Fiebre y escalofros. Dolores de cabeza, dolores en el cuerpo o dolores musculares. Dolor de garganta. Tos. Secrecin o congestin nasal. Molestias en el pecho. No querer comer tanto como lo hace normalmente. Sensacin de debilidad o  cansancio. Mareos. Malestar estomacal o vmitos. Cmo se trata? Si la gripe se detecta de forma temprana, puede recibir tratamiento con medicamentos antivirales. Esto puede ayudar a reducir la gravedad y la duracin de la enfermedad. Se los administrarn por boca o a travs de un tubo (catter) intravenoso. Cuidarse en su hogar puede ayudar a que mejoren los sntomas. El mdico puede recomendarle lo siguiente: Tomar medicamentos de venta libre. Beber abundante lquido. La gripe suele desaparecer sola. Si tiene sntomas muy graves u otros problemas, puede recibir tratamiento en un hospital. Siga estas instrucciones en su casa:   Actividad Descanse todo lo que sea necesario. Duerma lo suficiente. Qudese en su casa y no concurra al trabajo o a la escuela, como se lo haya indicado el mdico. No salga de su casa hasta que no haya tenido fiebre por 24 horas sin tomar medicamentos. Salga de su casa solamente para ir al mdico. Comida y bebida Tome una SRO (solucin de rehidratacin oral). Es una bebida que se vende en farmacias y tiendas. Beba suficiente lquido como para mantener la orina de color amarillo plido. En la medida en que pueda, beba lquidos transparentes en pequeas cantidades. Los lquidos transparentes son, por ejemplo: Agua. Trocitos de hielo. Jugo de frutas mezclado con agua. Bebidas deportivas de bajas caloras. Coma alimentos suaves que sean fciles de digerir. En la medida que pueda, consuma pequeas cantidades. Estos alimentos incluyen: Bananas. Pur de manzana. Arroz. Carnes magras. Tostadas. Galletas. No coma ni beba lo siguiente: Lquidos con alto contenido de azcar o cafena. Alcohol. Alimentos condimentados o con alto contenido de grasa. Indicaciones generales Use los medicamentos de venta libre y los recetados solamente   como se lo haya indicado el mdico. Use un humidificador de aire fro para que el aire de su casa est ms hmedo. Esto puede facilitar  la respiracin. Cuando utilice un humidificador de vapor fro, lmpielo a diario. Vace el agua y cmbiela por agua limpia. Al toser o estornudar, cbrase la boca y la nariz. Lvese las manos frecuentemente con agua y jabn y durante al menos 20 segundos. Esto tambin es importante despus de toser o estornudar. Si no dispone de agua y jabn, use desinfectante para manos con alcohol. Cumpla con todas las visitas de seguimiento. Cmo se previene?  Colquese la vacuna antigripal todos los aos. Puede colocarse la vacuna contra la gripe a fines de verano, en otoo o en invierno. Pregntele al mdico cundo debe aplicarse la vacuna contra la gripe. Evite el contacto con personas que estn enfermas durante el otoo y el invierno. Es la temporada del resfro y la gripe. Comunquese con un mdico si: Tiene sntomas nuevos. Tiene los siguientes sntomas: Dolor de pecho. Materia fecal lquida (diarrea). Fiebre. La tos empeora. Empieza a tener ms mucosidad. Tiene malestar estomacal. Vomita. Solicite ayuda de inmediato si: Le falta el aire. Tiene dificultad para respirar. La piel o las uas se ponen de un color azulado. Presenta dolor muy intenso o rigidez en el cuello. Tiene dolor de cabeza repentino. Le duele la cara o el odo de forma repentina. No puede comer ni beber sin vomitar. Estos sntomas pueden representar un problema grave que constituye una emergencia. Solicite atencin mdica de inmediato. Comunquese con el servicio de emergencias de su localidad (911 en los [place]). No espere a ver si los sntomas desaparecen. No conduzca por sus propios medios hasta el hospital. Resumen A la gripe tambin se la conoce como "influenza". Es una infeccin en los pulmones, la nariz y la garganta. Se transmite fcilmente de una persona a otra. Use los medicamentos de venta libre y los recetados solamente como se lo haya indicado el mdico. Aplicarse la vacuna contra la gripe todos los  aos es la mejor manera de no contagiarse la gripe. Esta informacin no tiene como fin reemplazar el consejo del mdico. Asegrese de hacerle al mdico cualquier pregunta que tenga. Document Revised: 07/04/2020 Document Reviewed: 07/04/2020 Elsevier Patient Education  2022 Elsevier Inc.  

## 2021-09-03 NOTE — Progress Notes (Signed)
Subjective:  Patient ID: Emma Beard, female    DOB: 1975/12/02  Age: 45 y.o. MRN: [id number]  CC: Diabetes   HPI Emma Beard is a 45 year old Hispanic female ( interpreter Victorio Palm [phone]) presents for follow-up of diabetes. Patient does not check blood sugar at home  Compliant with meds - Yes Checking CBGs? No  Fasting avg -   Postprandial average -  Exercising regularly? - Yes Watching carbohydrate intake? - Yes Neuropathy ? - No Hypoglycemic events - No  - Recovers with :   Pertinent ROS:  Polyuria - No Polydipsia - No Vision problems - No  Medications as noted below. Taking them regularly without complication/adverse reaction being reported today.   History Emma Beard has a past medical history of Gestational diabetes and Hyperlipidemia.   Emma Beard has a past surgical history that includes Wisdom tooth extraction (2011) and Cholecystectomy (09/18/2011).   Emma Beard family history includes Diabetes in Emma Beard brother and mother; Heart disease in Emma Beard father.Emma Beard reports that Emma Beard has never smoked. Emma Beard has never used smokeless tobacco. Emma Beard reports that Emma Beard does not drink alcohol and does not use drugs.  Current Outpatient Medications on File Prior to Visit  Medication Sig Dispense Refill   glipiZIDE (GLUCOTROL) 10 MG tablet Take 1 tablet (10 mg total) by mouth 2 (two) times daily before a meal. 180 tablet 1   lisinopril (ZESTRIL) 2.5 MG tablet Take 1 tablet (2.5 mg total) by mouth daily. 90 tablet 1   metFORMIN (GLUCOPHAGE) 1000 MG tablet Take 1 tablet (1,000 mg total) by mouth 2 (two) times daily with a meal. 180 tablet 3   pravastatin (PRAVACHOL) 20 MG tablet Take 1 tablet (20 mg total) by mouth daily. 90 tablet 3   sitaGLIPtin (JANUVIA) 100 MG tablet Take 1 tablet (100 mg total) by mouth daily. 90 tablet 1   No current facility-administered medications on file prior to visit.    ROS Pertinent positive and negative noted in HPI  Objective:  BP 115/83 (BP  Location: Right Arm, Patient Position: Sitting, Cuff Size: Normal)    Pulse 92    Temp (!) 97.3 F (36.3 C) (Temporal)    Ht 5\' 1"  (1.549 m)    Wt 153 lb 9.6 oz (69.7 kg)    LMP 08/20/2021 (Exact Date)    SpO2 97%    BMI 29.02 kg/m   BP Readings from Last 3 Encounters:  09/03/21 115/83  02/27/21 119/78  11/26/20 (!) 142/89    Wt Readings from Last 3 Encounters:  09/03/21 153 lb 9.6 oz (69.7 kg)  05/30/21 152 lb (68.9 kg)  02/27/21 154 lb 9.6 oz (70.1 kg)   Physical exam: General: Vital signs reviewed.  Patient is well-developed and well-nourished, xx in no acute distress and cooperative with exam. Head: Normocephalic and atraumatic. Eyes: EOMI, conjunctivae normal, no scleral icterus. Neck: Supple, trachea midline, normal ROM, no JVD, masses, thyromegaly, or carotid bruit present. Cardiovascular: RRR, S1 normal, S2 normal, no murmurs, gallops, or rubs. Pulmonary/Chest: Clear to auscultation bilaterally, no wheezes, rales, or rhonchi. Abdominal: Soft, non-tender, non-distended, BS +, no masses, organomegaly, or guarding present. Musculoskeletal: No joint deformities, erythema, or stiffness, ROM full and nontender. Extremities: No lower extremity edema bilaterally,  pulses symmetric and intact bilaterally. No cyanosis or clubbing. Neurological: A&O x3, Strength is normal Skin: Warm, dry and intact. No rashes or erythema. Psychiatric: Normal mood and affect. speech and behavior is normal. Cognition and memory are normal.    Lab Results  Component Value  Date   HGBA1C 7.6 (A) 05/30/2021   HGBA1C 7.1 (A) 02/27/2021   HGBA1C 7.7 (A) 11/26/2020    Lab Results  Component Value Date   WBC 6.5 05/30/2021   HGB 14.0 05/30/2021   HCT 42.5 05/30/2021   PLT 248 05/30/2021   GLUCOSE 131 (H) 05/30/2021   CHOL 179 05/30/2021   TRIG 63 05/30/2021   HDL 47 05/30/2021   LDLCALC 120 (H) 05/30/2021   ALT 21 05/30/2021   AST 22 05/30/2021   NA 137 05/30/2021   K 4.6 05/30/2021   CL 102  05/30/2021   CREATININE 0.57 05/30/2021   BUN 9 05/30/2021   CO2 23 05/30/2021   HGBA1C 7.6 (A) 05/30/2021     Assessment & Plan:   Emma Beard was seen today for diabetes.  Diagnoses and all orders for this visit:  Type 2 diabetes mellitus without complication, without long-term current use of insulin (HCC) -     HgB A1c 7.0 previously 7.6 improving . Continue to foods that are high in carbohydrates are the following rice, potatoes, tortillas, breads, sugars, and pastas.  Reduction in the intake (eating) will assist in lowering your blood sugars.  No change in medication   Encounter for screening mammogram for malignant neoplasm of breast Patient completed application for BCCP while in clinic and application has and faxed to [facility]. Patient aware that [facility] will contact Emma Beard directly to schedule appointment.   Cervical cancer screening Patient completed application for BCCP while in clinic and application has and faxed to [facility]. Patient aware that [facility] will contact Emma Beard directly to schedule appointment.   Influenza vaccine administered   I am having Emma Beard glipiZIDE, lisinopril, metFORMIN, sitaGLIPtin, and pravastatin.  No orders of the defined types were placed in this encounter.    Follow-up:   Return in about 3 months (around 12/02/2021) for fasting/DM.  The above assessment and management plan was discussed with the patient. The patient verbalized understanding of and has agreed to the management plan. Patient is aware to call the clinic if symptoms fail to improve or worsen. Patient is aware when to return to the clinic for a follow-up visit. Patient educated on when it is appropriate to go to the emergency department.   Gwinda Passe, NP-C

## 2021-09-10 ENCOUNTER — Ambulatory Visit: Payer: Self-pay

## 2021-09-24 ENCOUNTER — Other Ambulatory Visit: Payer: Self-pay

## 2021-09-24 ENCOUNTER — Ambulatory Visit: Payer: Self-pay | Attending: Primary Care

## 2021-11-03 ENCOUNTER — Other Ambulatory Visit: Payer: Self-pay | Admitting: Primary Care

## 2021-11-03 DIAGNOSIS — Z1231 Encounter for screening mammogram for malignant neoplasm of breast: Secondary | ICD-10-CM

## 2021-11-27 ENCOUNTER — Other Ambulatory Visit: Payer: Self-pay

## 2021-11-27 ENCOUNTER — Ambulatory Visit
Admission: RE | Admit: 2021-11-27 | Discharge: 2021-11-27 | Disposition: A | Discharge status: Home / Self Care | Payer: No Typology Code available for payment source | Source: Ambulatory Visit | Attending: Primary Care | Admitting: Primary Care

## 2021-11-27 DIAGNOSIS — Z1231 Encounter for screening mammogram for malignant neoplasm of breast: Secondary | ICD-10-CM

## 2021-12-01 ENCOUNTER — Other Ambulatory Visit: Payer: Self-pay | Admitting: Obstetrics and Gynecology

## 2021-12-01 DIAGNOSIS — R928 Other abnormal and inconclusive findings on diagnostic imaging of breast: Secondary | ICD-10-CM

## 2021-12-02 ENCOUNTER — Ambulatory Visit (INDEPENDENT_AMBULATORY_CARE_PROVIDER_SITE_OTHER): Payer: Self-pay | Admitting: Primary Care

## 2022-01-01 ENCOUNTER — Ambulatory Visit (INDEPENDENT_AMBULATORY_CARE_PROVIDER_SITE_OTHER): Payer: Self-pay | Admitting: Primary Care

## 2022-01-01 ENCOUNTER — Encounter (INDEPENDENT_AMBULATORY_CARE_PROVIDER_SITE_OTHER): Payer: Self-pay | Admitting: Primary Care

## 2022-01-01 VITALS — BP 120/76 | HR 95 | Temp 98.1°F | Ht 61.0 in | Wt 152.2 lb

## 2022-01-01 DIAGNOSIS — Z79899 Other long term (current) drug therapy: Secondary | ICD-10-CM

## 2022-01-01 DIAGNOSIS — I10 Essential (primary) hypertension: Secondary | ICD-10-CM

## 2022-01-01 DIAGNOSIS — E119 Type 2 diabetes mellitus without complications: Secondary | ICD-10-CM

## 2022-01-01 LAB — POCT GLYCOSYLATED HEMOGLOBIN (HGB A1C): Hemoglobin A1C: 8.1 % — AB (ref 4.0–5.6)

## 2022-01-01 MED ORDER — GLIPIZIDE 10 MG PO TABS: 10.0000 mg | ORAL_TABLET | Freq: Two times a day (BID) | ORAL | 1 refills | Status: DC

## 2022-01-01 MED ORDER — METFORMIN HCL 1000 MG PO TABS: 1000.0000 mg | ORAL_TABLET | Freq: Two times a day (BID) | ORAL | 3 refills | Status: DC

## 2022-01-01 MED ORDER — SITAGLIPTIN PHOSPHATE 100 MG PO TABS: 100.0000 mg | ORAL_TABLET | Freq: Every day | ORAL | 2 refills | Status: DC

## 2022-01-01 MED ORDER — LISINOPRIL 2.5 MG PO TABS: 2.5000 mg | ORAL_TABLET | Freq: Every day | ORAL | 1 refills | Status: DC

## 2022-01-01 NOTE — Patient Instructions (Signed)
Anlisis de hemoglobina A1c Hemoglobin A1C Test Por qu me debo realizar este anlisis? Es posible que se le realice el anlisis de hemoglobina A1c (anlisis de A1c) para lo siguiente: Evaluar su riesgo de desarrollar diabetes (diabetes mellitus). Diagnosticar la diabetes. Controlar el azcar en la sangre (glucosa) a largo plazo en personas que tienen diabetes y ayudarlas a tomar decisiones respecto de su tratamiento. Este anlisis puede hacerse junto con otros anlisis de glucemia, como losanlisis de glucemia en ayunas y tolerancia a la glucosa oral. Qu se analiza? La hemoglobina es un tipo de protena de la sangre que transporta el oxgeno. La glucosa se adhiere a la hemoglobina para formar la hemoglobina glicosilada. Este anlisis controla la cantidad de hemoglobina glicosilada en sangre, que es un buen indicador de la cantidad promedio de glucosa en sangre en los ltimos 2a 3 meses. Qu tipo de muestra se toma?  Para esta prueba, se extrae una muestra de sangre. Por lo general, paraextraerla, se introduce una aguja en un vaso sanguneo. Informe al mdico acerca de lo siguiente: Todos los medicamentos que usa, incluidos vitaminas, hierbas, gotas oftlmicas, cremas y medicamentos de venta libre. Cualquier trastorno de la sangre que tenga. Cirugas a las que se haya sometido. Cualquier afeccin mdica que tenga. Si est embarazada o podra estarlo. Cmo se informan los resultados? Los resultados se informan como un porcentaje que indica qu cantidad de hemoglobina tiene glucosa adherida a ella (est glicosilada). Su mdico comparar sus resultados con los rangos normales que se establecieron luego de realizar la prueba a un grupo grande de personas (rangos de referencia). Los rangos de referencia pueden variar entre laboratorios y hospitales. Los rangos de referencia habituales para esta prueba son los siguientes: Adultos o nios sin diabetes: del 4 % al 5.6 %. Adultos o nios con  diabetes y un buen control de la glucemia: menos del 7 %. Qu significan los resultados? Si usted tiene diabetes: Un resultado de menos del 7 % se considera normal, lo que significa que su glucemia est bien controlada. Un resultado de ms del 7 % significa que su glucemia no est bien controlada y que se debe ajustar su plan de tratamiento. Si usted no tiene diabetes: Un resultado dentro del valor de referencia se considera normal, lo que significa que no tiene riesgo de desarrollar diabetes. Un resultado entre 5.7 % y 6.4 % significa que tiene un riesgo alto de desarrollar diabetes y que tiene prediabetes. La prediabetes es una afeccin que se caracteriza por un nivel de glucemia que es ms alto de lo que debera ser, pero no es lo suficientemente alto como para que se diagnostique diabetes. El hecho de ser prediabtico lo pone en riesgo de desarrollar diabetes tipo 2. Es posible que se le realicen ms anlisis, incluso que se repita el anlisis de A1c. Los resultados de 6,5 % o ms altos en dos anlisis de A1c separados significan que usted tiene diabetes. Pueden hacerle ms anlisis para confirmar el diagnstico. Los valores de A1c anormalmente bajos pueden ser provocados por: Embarazo. Prdida de sangre grave. Recibir sangre donada (transfusiones). Valores bajos en el recuento de glbulos rojos (anemia). Insuficiencia renal a largo plazo. Algunas formas inusuales (variantes) de la hemoglobina. Hable con su mdico sobre lo que significan sus resultados. Preguntas para hacerle al mdico Consulte a su mdico o pregunte en el departamento donde se realiza la prueba acerca de lo siguiente: Cundo estarn disponibles mis resultados? Cmo obtendr mis resultados? Cules son las opciones de tratamiento? Qu otras   pruebas necesito? Cules son los prximos pasos que debo seguir? Resumen El anlisis de A1c puede hacerse para evaluar su riesgo de desarrollar diabetes, para diagnosticar la  diabetes y para monitorear el control a largo plazo del azcar en la sangre (glucosa) en personas que tienen diabetes y ayudarlas a tomar decisiones respecto al tratamiento. La hemoglobina es un tipo de protena de la sangre que transporta el oxgeno. La glucosa se adhiere a la hemoglobina para formar la hemoglobina glicosilada. Este anlisis controla la cantidad de hemoglobina glicosilada en sangre, que es un buen indicador de la cantidad promedio de glucosa en sangre en los ltimos 2 a 3 meses. Hable con su mdico sobre lo que significan sus resultados. Esta informacin no tiene como fin reemplazar el consejo del mdico. Asegresede hacerle al mdico cualquier pregunta que tenga. Document Revised: 07/09/2020 Document Reviewed: 07/09/2020 Elsevier Patient Education  2022 [employer] Inc.  

## 2022-01-01 NOTE — Progress Notes (Signed)
? ?Subjective:  ?Patient ID: Emma Beard, female    DOB: 1976/01/20  Age: 46 y.o. MRN: [id number] ? ?CC: Diabetes ? ?8 tor ?HPI ?EmmaBurnis Beard presents is a 32 Hispanic female Victorino Dike [phone])  for follow-up of diabetes. Patient does not check blood sugar at home . Also, management of HTN- Denies shortness of breath, headaches, chest pain or lower extremity edema  ? ?Compliant with meds - Yes ?Checking CBGs? No ? Fasting avg -  ? Postprandial average -  ?Exercising regularly? - Yes ?Watching carbohydrate intake? - Yes ?Neuropathy ? - No ?Hypoglycemic events - No ? - Recovers with :  ? ?Pertinent ROS:  ?Polyuria - No ?Polydipsia - No ?Vision problems - No ? ?Medications as noted below. Taking them regularly without complication/adverse reaction being reported today.  ? ?History ?[name] has a past medical history of Gestational diabetes and Hyperlipidemia.  ? ?She has a past surgical history that includes Wisdom tooth extraction (2011) and Cholecystectomy (09/18/2011).  ? ?Her family history includes Diabetes in her brother and mother; Heart disease in her father.She reports that she has never smoked. She has never used smokeless tobacco. She reports that she does not drink alcohol and does not use drugs. ? ?Current Outpatient Medications on File Prior to Visit  ?Medication Sig Dispense Refill  ? pravastatin (PRAVACHOL) 20 MG tablet Take 1 tablet (20 mg total) by mouth daily. 90 tablet 3  ? ?No current facility-administered medications on file prior to visit.  ? ? ?ROS ?Comprehensive ROS Pertinent positive and negative noted in HPI   ? ?Objective:  ?BP 120/76 (BP Location: Right Arm, Patient Position: Sitting, Cuff Size: Normal)   Pulse 95   Temp 98.1 ?F (36.7 ?C) (Oral)   Ht 5\' 1"  (1.549 m)   Wt 152 lb 3.2 oz (69 kg)   LMP 12/16/2021   SpO2 99%   BMI 28.76 kg/m?  ? ?BP Readings from Last 3 Encounters:  ?01/01/22 120/76  ?09/03/21 115/83  ?02/27/21 119/78  ? ? ?Wt Readings from Last 3  Encounters:  ?01/01/22 152 lb 3.2 oz (69 kg)  ?09/03/21 153 lb 9.6 oz (69.7 kg)  ?05/30/21 152 lb (68.9 kg)  ? ? ?Physical Exam ?Vitals reviewed.  ?Constitutional:   ?   Appearance: Normal appearance.  ?HENT:  ?   Head: Normocephalic.  ?   Right Ear: Tympanic membrane and external ear normal.  ?   Left Ear: Tympanic membrane and external ear normal.  ?   Nose: Nose normal.  ?Eyes:  ?   Extraocular Movements: Extraocular movements intact.  ?   Conjunctiva/sclera: Conjunctivae normal.  ?   Pupils: Pupils are equal, round, and reactive to light.  ?Cardiovascular:  ?   Rate and Rhythm: Normal rate and regular rhythm.  ?Pulmonary:  ?   Effort: Pulmonary effort is normal.  ?   Breath sounds: Normal breath sounds.  ?Abdominal:  ?   General: Bowel sounds are normal. There is distension.  ?   Palpations: Abdomen is soft.  ?Musculoskeletal:     ?   General: Normal range of motion.  ?   Cervical back: Normal range of motion.  ?Skin: ?   General: Skin is warm and dry.  ?Neurological:  ?   Mental Status: She is alert and oriented to person, place, and time.  ?Psychiatric:     ?   Mood and Affect: Mood normal.     ?   Behavior: Behavior normal.     ?  Thought Content: Thought content normal.     ?   Judgment: Judgment normal.  ? ?Lab Results  ?Component Value Date  ? HGBA1C 8.1 (A) 01/01/2022  ? HGBA1C 7.0 (A) 09/03/2021  ? HGBA1C 7.6 (A) 05/30/2021  ? ? ?Lab Results  ?Component Value Date  ? WBC 6.5 05/30/2021  ? HGB 14.0 05/30/2021  ? HCT 42.5 05/30/2021  ? PLT 248 05/30/2021  ? GLUCOSE 131 (H) 05/30/2021  ? CHOL 179 05/30/2021  ? TRIG 63 05/30/2021  ? HDL 47 05/30/2021  ? LDLCALC 120 (H) 05/30/2021  ? ALT 21 05/30/2021  ? AST 22 05/30/2021  ? NA 137 05/30/2021  ? K 4.6 05/30/2021  ? CL 102 05/30/2021  ? CREATININE 0.57 05/30/2021  ? BUN 9 05/30/2021  ? CO2 23 05/30/2021  ? HGBA1C 8.1 (A) 01/01/2022  ? ? ? ?Assessment & Plan:  ?Silvestre MesiCristina was seen today for diabetes. ? ?Diagnoses and all orders for this visit: ? ?Type 2 diabetes  mellitus without complication, without long-term current use of insulin (HCC) ?-     HgB A1c 8.1 increase admits to 6-8 tortillas daily negotiated to 2-4 daily monitoring and decreasing rice, potatoes, breads, sugars, and pastas.  Reduction in the intake (eating) will assist in lowering your blood sugars. Exercising daily 30mins or 150 hrs weekly  ? ?-     glipiZIDE (GLUCOTROL) 10 MG tablet; Take 1 tablet (10 mg total) by mouth 2 (two) times daily before a meal. ?-     metFORMIN (GLUCOPHAGE) 1000 MG tablet; Take 1 tablet (1,000 mg total) by mouth 2 (two) times daily with a meal. ? ?Essential hypertension ?-     lisinopril (ZESTRIL) 2.5 MG tablet; Take 1 tablet (2.5 mg total) by mouth daily. ? ?Medication management ?-     glipiZIDE (GLUCOTROL) 10 MG tablet; Take 1 tablet (10 mg total) by mouth 2 (two) times daily before a meal. ?-     lisinopril (ZESTRIL) 2.5 MG tablet; Take 1 tablet (2.5 mg total) by mouth daily. ?-     metFORMIN (GLUCOPHAGE) 1000 MG tablet; Take 1 tablet (1,000 mg total) by mouth 2 (two) times daily with a meal. ?-     sitaGLIPtin (JANUVIA) 100 MG tablet; Take 1 tablet (100 mg total) by mouth daily. ? ?  ? ? ? ?I am having Tisha Trejo-Orozco maintain her pravastatin, glipiZIDE, lisinopril, metFORMIN, and sitaGLIPtin. ? ?Meds ordered this encounter  ?Medications  ? glipiZIDE (GLUCOTROL) 10 MG tablet  ?  Sig: Take 1 tablet (10 mg total) by mouth 2 (two) times daily before a meal.  ?  Dispense:  180 tablet  ?  Refill:  1  ? lisinopril (ZESTRIL) 2.5 MG tablet  ?  Sig: Take 1 tablet (2.5 mg total) by mouth daily.  ?  Dispense:  90 tablet  ?  Refill:  1  ? metFORMIN (GLUCOPHAGE) 1000 MG tablet  ?  Sig: Take 1 tablet (1,000 mg total) by mouth 2 (two) times daily with a meal.  ?  Dispense:  180 tablet  ?  Refill:  3  ? sitaGLIPtin (JANUVIA) 100 MG tablet  ?  Sig: Take 1 tablet (100 mg total) by mouth daily.  ?  Dispense:  90 tablet  ?  Refill:  2  ? ? ? ?Follow-up:  ? ?Return in about 3 months (around  04/02/2022) for DM/fasting . ? ?The above assessment and management plan was discussed with the patient. The patient verbalized understanding of and has agreed  to the management plan. Patient is aware to call the clinic if symptoms fail to improve or worsen. Patient is aware when to return to the clinic for a follow-up visit. Patient educated on when it is appropriate to go to the emergency department.  ? ?Gwinda Passe, NP-C ? ?  ?

## 2022-03-21 ENCOUNTER — Ambulatory Visit (HOSPITAL_COMMUNITY)
Admission: EM | Admit: 2022-03-21 | Discharge: 2022-03-21 | Disposition: A | Discharge status: Home / Self Care | Payer: No Typology Code available for payment source | Attending: Emergency Medicine | Admitting: Emergency Medicine

## 2022-03-21 DIAGNOSIS — N644 Mastodynia: Secondary | ICD-10-CM

## 2022-03-21 DIAGNOSIS — K2960 Other gastritis without bleeding: Secondary | ICD-10-CM

## 2022-03-21 MED ORDER — PANTOPRAZOLE SODIUM 40 MG PO TBEC: 40.0000 mg | DELAYED_RELEASE_TABLET | Freq: Every day | ORAL | 2 refills | Status: DC

## 2022-03-21 NOTE — Discharge Instructions (Addendum)
Please call your primary doctor to have the breast ultrasound scheduled  Llame a su mdica de cabecera para programar la ecografa mamaria  Take the reflux medicine once daily  Tome el medicamento para el reflujo una vez al da (Protonix)  Go to the emergency department if symptoms get worse  Vaya al departamento de emergencias si los sntomas empeoran

## 2022-03-21 NOTE — ED Triage Notes (Signed)
Pt reports pain under her breast area x 2-3 days. She reports the pain is on/off.

## 2022-03-21 NOTE — ED Provider Notes (Signed)
[facility]    CSN: [id number] Arrival date & time: 03/21/22  1213     History   Chief Complaint Chief Complaint  Patient presents with   Breast Pain    HPI Emma Beard is a 46 y.o. female.  Language interpreter used for this encounter. Presents with 3 day history of pain under the breasts. Reports feeling "air trapped" under the breast after she eats and feels the discomfort travels into the base of both breasts.  She denies any rash, irritation, skin changes beneath the breasts. Denies fever, bleeding or discharge from the nipple.  Does report reflux sensations from the belly into the chest.  Denies any chest pain or shortness of breath.  No abdominal pain, nausea, vomiting/diarrhea.  She did have a mammogram in March and was instructed to return for follow-up ultrasound.  She did not understand the letter that was sent in English regarding needing an ultrasound and therefore never had it done.  History of reflux for which she used to take medicine.  She stopped because her symptoms had gone away.  They only just returned recently.  Past Medical History:  Diagnosis Date   Gestational diabetes    Hyperlipidemia     Patient Active Problem List   Diagnosis Date Noted   Gallstone pancreatitis 09/18/2011   Gestational diabetes 09/18/2011    Past Surgical History:  Procedure Laterality Date   CHOLECYSTECTOMY  09/18/2011   Procedure: LAPAROSCOPIC CHOLECYSTECTOMY WITH INTRAOPERATIVE CHOLANGIOGRAM;  Surgeon: Cherylynn Ridges III, MD;  Location: [facility];  Service: General;  Laterality: N/A;   WISDOM TOOTH EXTRACTION  2011    OB History     Gravida  2   Para  2   Term  2   Preterm  0   AB  0   Living  2      SAB  0   IAB  0   Ectopic  0   Multiple      Live Births  2            Home Medications    Prior to Admission medications   Medication Sig Start Date End Date Taking? Authorizing Provider  pantoprazole (PROTONIX) 40 MG tablet  Take 1 tablet (40 mg total) by mouth daily. 03/21/22  Yes Baelynn Schmuhl, Lurena Joiner, PA-C  glipiZIDE (GLUCOTROL) 10 MG tablet Take 1 tablet (10 mg total) by mouth 2 (two) times daily before a meal. 01/01/22   Grayce Sessions, NP  lisinopril (ZESTRIL) 2.5 MG tablet Take 1 tablet (2.5 mg total) by mouth daily. 01/01/22   Grayce Sessions, NP  metFORMIN (GLUCOPHAGE) 1000 MG tablet Take 1 tablet (1,000 mg total) by mouth 2 (two) times daily with a meal. 01/01/22   Grayce Sessions, NP  pravastatin (PRAVACHOL) 20 MG tablet Take 1 tablet (20 mg total) by mouth daily. 09/03/21   Grayce Sessions, NP  sitaGLIPtin (JANUVIA) 100 MG tablet Take 1 tablet (100 mg total) by mouth daily. 01/01/22   Grayce Sessions, NP    Family History Family History  Problem Relation Age of Onset   Diabetes Mother        died from diabetes   Heart disease Father        died from heart murmur   Diabetes Brother        died from diabetes    Social History Social History   Tobacco Use   Smoking status: Never   Smokeless tobacco: Never  Substance Use  Topics   Alcohol use: No   Drug use: No     Allergies   Patient has no known allergies.   Review of Systems Review of Systems  Per HPI  Physical Exam Triage Vital Signs ED Triage Vitals  Enc Vitals Group     BP 03/21/22 1400 (!) 141/85     Pulse Rate 03/21/22 1400 75     Resp 03/21/22 1400 16     Temp 03/21/22 1402 98 F (36.7 C)     Temp Source 03/21/22 1400 Oral     SpO2 03/21/22 1400 100 %     Weight --      Height --      Head Circumference --      Peak Flow --      Pain Score --      Pain Loc --      Pain Edu? --      Excl. in GC? --    No data found.  Updated Vital Signs BP (!) 141/85 (BP Location: Left Arm)   Pulse 75   Temp 98 F (36.7 C) (Oral)   Resp 16   SpO2 100%     Physical Exam Vitals and nursing note reviewed. Exam conducted with a chaperone present.  Constitutional:      General: She is not in acute distress.     Appearance: Normal appearance.  HENT:     Mouth/Throat:     Pharynx: Oropharynx is clear.  Eyes:     Conjunctiva/sclera: Conjunctivae normal.  Cardiovascular:     Rate and Rhythm: Normal rate and regular rhythm.     Pulses: Normal pulses.     Heart sounds: Normal heart sounds.  Pulmonary:     Effort: Pulmonary effort is normal.     Breath sounds: Normal breath sounds.  Chest:     Chest wall: No mass, swelling, tenderness, crepitus or edema.  Breasts:    Right: No nipple discharge, skin change or tenderness.     Left: No nipple discharge, skin change or tenderness.  Abdominal:     General: Abdomen is flat.     Tenderness: There is no abdominal tenderness.  Musculoskeletal:        General: Normal range of motion.     Cervical back: Normal range of motion.  Skin:    General: Skin is warm and dry.  Neurological:     Mental Status: She is alert and oriented to person, place, and time.     UC Treatments / Results  Labs (all labs ordered are listed, but only abnormal results are displayed) Labs Reviewed - No data to display  EKG  Radiology No results found.  Procedures Procedures (including critical care time)  Medications Ordered in UC Medications - No data to display  Initial Impression / Assessment and Plan / UC Course  I have reviewed the triage vital signs and the nursing notes.  Pertinent labs & imaging results that were available during my care of the patient were reviewed by me and considered in my medical decision making (see chart for details).  Exam is unremarkable.  There is no rash or skin changes bilateral breasts.  No pain with palpation.  She does not have any abdominal pain.  Symptoms do seem to sound like indigestion or reflux.  I want her to restart her reflux medicine, Protonix once daily.  Instructed her to follow-up with her primary care provider and schedule the breast ultrasound.  Patient agrees to plan.  Return precautions discussed. She is  discharged in stable condition with all questions answered.  Final Clinical Impressions(s) / UC Diagnoses   Final diagnoses:  Breast pain  Reflux gastritis     Discharge Instructions      Please call your primary doctor to have the breast ultrasound scheduled  Llame a su mdica de cabecera para programar la ecografa mamaria  Take the reflux medicine once daily  Tome el medicamento para el reflujo una vez al da (Protonix)  Go to the emergency department if symptoms get worse  Vaya al departamento de emergencias si los sntomas empeoran      ED Prescriptions     Medication Sig Dispense Auth. Provider   pantoprazole (PROTONIX) 40 MG tablet Take 1 tablet (40 mg total) by mouth daily. 30 tablet Jorma Tassinari, Lurena Joiner, PA-C      PDMP not reviewed this encounter.   Phyillis Dascoli, Lurena Joiner, [place] 03/21/22 1448

## 2022-04-02 ENCOUNTER — Ambulatory Visit (INDEPENDENT_AMBULATORY_CARE_PROVIDER_SITE_OTHER): Payer: Self-pay | Admitting: Primary Care

## 2022-06-24 ENCOUNTER — Other Ambulatory Visit: Payer: Self-pay

## 2022-06-24 MED ORDER — METFORMIN HCL 500 MG PO TABS
ORAL_TABLET | ORAL | 3 refills | Status: DC
  Filled 2022-06-24: qty 60, 30d supply, fill #0
  Filled 2022-10-28: qty 60, 30d supply, fill #1

## 2022-06-26 ENCOUNTER — Other Ambulatory Visit: Payer: Self-pay

## 2022-10-28 ENCOUNTER — Other Ambulatory Visit: Payer: Self-pay

## 2022-10-30 ENCOUNTER — Other Ambulatory Visit: Payer: Self-pay

## 2024-03-13 ENCOUNTER — Other Ambulatory Visit: Payer: Self-pay

## 2024-03-21 ENCOUNTER — Encounter: Payer: Self-pay | Admitting: Internal Medicine

## 2024-03-21 ENCOUNTER — Ambulatory Visit: Payer: Self-pay | Admitting: Internal Medicine

## 2024-03-21 VITALS — BP 120/78 | HR 76 | Resp 16 | Ht 61.0 in | Wt 132.2 lb

## 2024-03-21 DIAGNOSIS — Z114 Encounter for screening for human immunodeficiency virus [HIV]: Secondary | ICD-10-CM

## 2024-03-21 DIAGNOSIS — E119 Type 2 diabetes mellitus without complications: Secondary | ICD-10-CM

## 2024-03-21 DIAGNOSIS — Z1159 Encounter for screening for other viral diseases: Secondary | ICD-10-CM

## 2024-03-21 DIAGNOSIS — K029 Dental caries, unspecified: Secondary | ICD-10-CM

## 2024-03-21 DIAGNOSIS — E7800 Pure hypercholesterolemia, unspecified: Secondary | ICD-10-CM

## 2024-03-21 DIAGNOSIS — K0500 Acute gingivitis, plaque induced: Secondary | ICD-10-CM

## 2024-03-21 DIAGNOSIS — Z23 Encounter for immunization: Secondary | ICD-10-CM

## 2024-03-21 MED ORDER — BLOOD GLUCOSE MONITORING SUPPL DEVI: 0 refills | Status: DC

## 2024-03-21 MED ORDER — OZEMPIC (0.25 OR 0.5 MG/DOSE) 2 MG/3ML ~~LOC~~ SOPN: PEN_INJECTOR | SUBCUTANEOUS | 11 refills | Status: DC

## 2024-03-21 MED ORDER — LANCET DEVICE MISC: 0 refills | Status: DC

## 2024-03-21 MED ORDER — BLOOD GLUCOSE TEST VI STRP: ORAL_STRIP | 3 refills | Status: DC

## 2024-03-21 MED ORDER — LANCETS MISC. MISC: 3 refills | Status: DC

## 2024-03-21 MED ORDER — EMPAGLIFLOZIN 10 MG PO TABS: 10.0000 mg | ORAL_TABLET | Freq: Every day | ORAL | 3 refills | Status: DC

## 2024-03-21 NOTE — Progress Notes (Signed)
 Subjective:    Patient ID: Emma Beard, female   DOB: 03-16-1976, 48 y.o.   MRN: [id number]   HPI  Here to establish [name] interprets  Has not taken any of her meds for DM, hypertension, or hyperlipidemia for about 6 months.  Last visit in chart is from 12/2021.  She thinks she went to [facility] clinic in 2024.     DM:  Was taking Januvia   100 mg daily, Metformin  1000 mg twice daily and Glipizide  10 mg twice daily.  Last A1C was 8.1% in 2023.  Last year, states she was at 11%.  States she was diagnosed about 4 years ago.  She has lost about 30 lbs since she was last seen by a provider.  States she gets anxious to eat--unable to get her to clarify.   She feels she knows how to put together healthy meals.   She has some sort of calisthenics with weights in her home.   She does walk outside at least 30 minutes daily.    2.  ?Microalbuminuria:  Sounds like this is reason for Lisinopril .  Not noted in this chart.    3.  Elevated LDL:  last check in 2022 here was 120.  She cannot say what her numbers were last year.    4.  History of GERD vs gastritis:  was on Pantoprazole  at one time, but has not required over past 6 months.    No outpatient medications have been marked as taking for the 03/21/24 encounter (Office Visit) with Adella Norris, MD.   No Known Allergies  Past Medical History:  Diagnosis Date   Gestational diabetes    Hyperlipidemia    Past Surgical History:  Procedure Laterality Date   CHOLECYSTECTOMY  09/18/2011   Procedure: LAPAROSCOPIC CHOLECYSTECTOMY WITH INTRAOPERATIVE CHOLANGIOGRAM;  Surgeon: Lynwood MALVA Pina III, MD;  Location: [facility];  Service: General;  Laterality: N/A;   WISDOM TOOTH EXTRACTION  09/21/2009   Family History  Problem Relation Age of Onset   Heart disease Mother        MI cause of death   Hypertension Mother    Diabetes Mother        died from diabetes   Heart disease Father        Heart murmur--not clear if died from CHF    Diabetes Sister    Diabetes Sister    Hyperlipidemia Sister        High triglycerides   Heart disease Brother        CHF vs renal failure--had peripheral edema   Diabetes Brother        died from diabetes   Heart disease Brother        pacemaker   Diabetes Brother    Heart disease Brother        heart murmur like father ?CHF   Social History   Socioeconomic History   Marital status: Married    Spouse name: Orlando   Number of children: 2   Years of education: 6   Highest education level: 6th grade  Occupational History   Occupation: Housewife  Tobacco Use   Smoking status: Never    Passive exposure: Never   Smokeless tobacco: Never  Vaping Use   Vaping status: Never Used  Substance and Sexual Activity   Alcohol use: No   Drug use: No   Sexual activity: Yes    Birth control/protection: None  Other Topics Concern   Not on file  Social  History Narrative   Lives at home with husband and their 2 children.   Social Drivers of Corporate investment banker Strain: Not on file  Food Insecurity: No Food Insecurity (03/21/2024)   Hunger Vital Sign    Worried About Running Out of Food in the Last Year: Never true    Ran Out of Food in the Last Year: Never true  Transportation Needs: No Transportation Needs (03/21/2024)   PRAPARE - Administrator, Civil Service (Medical): No    Lack of Transportation (Non-Medical): No  Physical Activity: Not on file  Stress: Not on file  Social Connections: Not on file  Intimate Partner Violence: Not At Risk (03/21/2024)   Humiliation, Afraid, Rape, and Kick questionnaire    Fear of Current or Ex-Partner: No    Emotionally Abused: No    Physically Abused: No    Sexually Abused: No      Review of Systems    Objective:   BP 120/78 (BP Location: Left Arm, Patient Position: Sitting, Cuff Size: Normal)   Pulse 76   Resp 16   Ht 5' 1 (1.549 m)   Wt 132 lb 4 oz (60 kg)   LMP 03/10/2024 (Exact Date)   BMI 24.99 kg/m    Physical Exam HENT:     Head: Normocephalic and atraumatic.     Right Ear: Tympanic membrane, ear canal and external ear normal.     Left Ear: Tympanic membrane, ear canal and external ear normal.     Nose: Nose normal.     Mouth/Throat:     Mouth: Mucous membranes are moist.     Pharynx: Oropharynx is clear.     Comments: Giingival inflammation and dental decay Eyes:     Extraocular Movements: Extraocular movements intact.     Conjunctiva/sclera: Conjunctivae normal.     Pupils: Pupils are equal, round, and reactive to light.     Comments: Discs sharp  Neck:     Thyroid: No thyroid mass or thyromegaly.  Cardiovascular:     Rate and Rhythm: Normal rate and regular rhythm.     Heart sounds: S1 normal and S2 normal. No murmur heard.    No friction rub. No S3 or S4 sounds.     Comments: No carotid bruits.  Carotid, radial, femoral, DP and PT pulses normal and equal.   Pulmonary:     Effort: Pulmonary effort is normal.     Breath sounds: Normal breath sounds and air entry.  Abdominal:     General: Abdomen is flat. Bowel sounds are normal.     Palpations: Abdomen is soft. There is no hepatomegaly, splenomegaly or mass.     Tenderness: There is no abdominal tenderness.     Hernia: No hernia is present.  Musculoskeletal:     Cervical back: Normal range of motion and neck supple.     Right lower leg: No edema.     Left lower leg: No edema.  Neurological:     Mental Status: She is alert.      Assessment & Plan   DM:  Likely poorly controlled.  Start Ozempic  0.25 mg once weekly and Jardiance  10 mg daily.  Sugar log sheets given and Rx for glucometer and supplies to check twice daily before meals.  Call when has Ozempic  and can instruct on injection and have her follow 2 weeks with sugar log after starting meds.  A1C, Urine microalbumin/crea Eye referral   2.  Elevated LDL:  FLP  3.  Gingival inflammation and dental decay:  dental referral  4.  HM:  screen for HIV, Hep C,  Pneumococcal 20 vaccine today.  Encourage influenza and COVID vaccines in fall.

## 2024-03-22 LAB — COMPREHENSIVE METABOLIC PANEL WITH GFR
ALT: 22 IU/L (ref 0–32)
AST: 17 IU/L (ref 0–40)
Albumin: 4.9 g/dL (ref 3.9–4.9)
Alkaline Phosphatase: 72 IU/L (ref 44–121)
BUN/Creatinine Ratio: 27 — ABNORMAL HIGH (ref 9–23)
BUN: 18 mg/dL (ref 6–24)
Bilirubin Total: 0.2 mg/dL (ref 0.0–1.2)
CO2: 21 mmol/L (ref 20–29)
Calcium: 9.6 mg/dL (ref 8.7–10.2)
Chloride: 98 mmol/L (ref 96–106)
Creatinine, Ser: 0.67 mg/dL (ref 0.57–1.00)
Globulin, Total: 2.6 g/dL (ref 1.5–4.5)
Glucose: 232 mg/dL — ABNORMAL HIGH (ref 70–99)
Potassium: 4.2 mmol/L (ref 3.5–5.2)
Sodium: 136 mmol/L (ref 134–144)
Total Protein: 7.5 g/dL (ref 6.0–8.5)
eGFR: 108 mL/min/{1.73_m2} (ref >59)

## 2024-03-22 LAB — CBC WITH DIFFERENTIAL/PLATELET
Basophils Absolute: 0 10*3/uL (ref 0.0–0.2)
Basos: 0 %
EOS (ABSOLUTE): 0.1 10*3/uL (ref 0.0–0.4)
Eos: 1 %
Hematocrit: 47 % — ABNORMAL HIGH (ref 34.0–46.6)
Hemoglobin: 15.2 g/dL (ref 11.1–15.9)
Immature Grans (Abs): 0 10*3/uL (ref 0.0–0.1)
Immature Granulocytes: 0 %
Lymphocytes Absolute: 2.8 10*3/uL (ref 0.7–3.1)
Lymphs: 29 %
MCH: 28.1 pg (ref 26.6–33.0)
MCHC: 32.3 g/dL (ref 31.5–35.7)
MCV: 87 fL (ref 79–97)
Monocytes Absolute: 0.5 10*3/uL (ref 0.1–0.9)
Monocytes: 6 %
Neutrophils Absolute: 6 10*3/uL (ref 1.4–7.0)
Neutrophils: 64 %
Platelets: 221 10*3/uL (ref 150–450)
RBC: 5.41 x10E6/uL — ABNORMAL HIGH (ref 3.77–5.28)
RDW: 13.1 % (ref 11.7–15.4)
WBC: 9.5 10*3/uL (ref 3.4–10.8)

## 2024-03-22 LAB — LIPID PANEL W/O CHOL/HDL RATIO
Cholesterol, Total: 212 mg/dL — ABNORMAL HIGH (ref 100–199)
HDL: 74 mg/dL (ref >39)
LDL Chol Calc (NIH): 123 mg/dL — ABNORMAL HIGH (ref 0–99)
Triglycerides: 87 mg/dL (ref 0–149)
VLDL Cholesterol Cal: 15 mg/dL (ref 5–40)

## 2024-03-22 LAB — MICROALBUMIN / CREATININE URINE RATIO
Creatinine, Urine: 16 mg/dL
Microalb/Creat Ratio: 20 mg/g{creat} (ref 0–29)
Microalbumin, Urine: 3.2 ug/mL

## 2024-03-22 LAB — HIV ANTIBODY (ROUTINE TESTING W REFLEX): HIV Screen 4th Generation wRfx: NONREACTIVE

## 2024-03-22 LAB — HGB A1C W/O EAG: Hgb A1c MFr Bld: 11.8 % — ABNORMAL HIGH (ref 4.8–5.6)

## 2024-03-22 LAB — HEPATITIS C ANTIBODY: Hep C Virus Ab: NONREACTIVE

## 2024-06-12 ENCOUNTER — Ambulatory Visit: Payer: Self-pay | Admitting: Internal Medicine

## 2024-06-12 ENCOUNTER — Telehealth: Payer: Self-pay | Admitting: Internal Medicine

## 2024-06-12 DIAGNOSIS — K029 Dental caries, unspecified: Secondary | ICD-10-CM | POA: Insufficient documentation

## 2024-06-12 DIAGNOSIS — E119 Type 2 diabetes mellitus without complications: Secondary | ICD-10-CM | POA: Insufficient documentation

## 2024-06-12 NOTE — Telephone Encounter (Signed)
 Called patient, patient did not answer and unable to leave voicemail as voicemail is not set up.

## 2024-07-19 NOTE — Telephone Encounter (Signed)
 Spoke with patient today and asked her if she ever went to apply for Ozempic  and Jardiance ,   Patient states she went to the health department and was told by the pharmacist that she had to apply for those medications. Patient states she did not apply because she was unable to obtain some documents that they require for the application process.  Asked patient if she called the health department and share with them that she was unable to obtain documents , patient states she did not notify them she just never went back.   Notified patient she needs to go back and share with them she is unable to obtain documents and asked if there is another alternative document that she can provide instead. Notified patient she needs to apply asap as she needs to start medication as her diabetes was not well controlled and cholesterol not at goal.   Patient agrees to call and start application process.   Patient will be here for a Doctor's appointment on 08/01/2024.

## 2024-07-21 ENCOUNTER — Other Ambulatory Visit: Payer: Self-pay

## 2024-07-21 DIAGNOSIS — E119 Type 2 diabetes mellitus without complications: Secondary | ICD-10-CM

## 2024-07-22 ENCOUNTER — Ambulatory Visit: Payer: Self-pay | Admitting: Internal Medicine

## 2024-07-22 LAB — HEMOGLOBIN A1C
Est. average glucose Bld gHb Est-mCnc: 295 mg/dL
Hgb A1c MFr Bld: 11.9 % — ABNORMAL HIGH (ref 4.8–5.6)

## 2024-07-28 ENCOUNTER — Other Ambulatory Visit: Payer: Self-pay

## 2024-08-01 ENCOUNTER — Ambulatory Visit: Payer: Self-pay | Admitting: Internal Medicine

## 2024-08-01 ENCOUNTER — Encounter: Payer: Self-pay | Admitting: Internal Medicine

## 2024-08-01 VITALS — BP 110/65 | HR 80 | Resp 16 | Ht 62.0 in | Wt 130.0 lb

## 2024-08-01 DIAGNOSIS — E119 Type 2 diabetes mellitus without complications: Secondary | ICD-10-CM

## 2024-08-01 MED ORDER — LANCETS MISC. MISC: 3 refills | Status: AC

## 2024-08-01 MED ORDER — BLOOD GLUCOSE MONITORING SUPPL DEVI: 0 refills | Status: AC

## 2024-08-01 MED ORDER — LANCET DEVICE MISC: 0 refills | Status: AC

## 2024-08-01 MED ORDER — BLOOD GLUCOSE TEST VI STRP: ORAL_STRIP | 3 refills | Status: AC

## 2024-08-01 MED ORDER — SIMVASTATIN 40 MG PO TABS: 40.0000 mg | ORAL_TABLET | Freq: Every day | ORAL | 11 refills | Status: AC

## 2024-08-01 MED ORDER — METFORMIN HCL 1000 MG PO TABS: 1000.0000 mg | ORAL_TABLET | Freq: Two times a day (BID) | ORAL | 11 refills | Status: AC

## 2024-08-01 MED ORDER — EMPAGLIFLOZIN 10 MG PO TABS: 10.0000 mg | ORAL_TABLET | Freq: Every day | ORAL | 3 refills | Status: AC

## 2024-08-01 MED ORDER — GLIPIZIDE 10 MG PO TABS: 10.0000 mg | ORAL_TABLET | Freq: Two times a day (BID) | ORAL | 11 refills | Status: AC

## 2024-08-01 MED ORDER — OZEMPIC (0.25 OR 0.5 MG/DOSE) 2 MG/3ML ~~LOC~~ SOPN: PEN_INJECTOR | SUBCUTANEOUS | 11 refills | Status: AC

## 2024-08-01 NOTE — Progress Notes (Unsigned)
    Subjective:    Patient ID: Emma Beard, female   DOB: 12-19-1975, 48 y.o.   MRN: [id number]   HPI  Emma Beard interprets   DM:  did not get papers together for application with MAP meds:  Ozempic , Jardiance .  She is not taking Glipizide  or Metformin  and A1C remains high at 11.9%.  Current Meds  Medication Sig  . metFORMIN  (GLUCOPHAGE ) 1000 MG tablet Take 1 tablet (1,000 mg total) by mouth 2 (two) times daily with a meal.   No Known Allergies   Review of Systems    Objective:   BP 110/65 (BP Location: Left Arm, Patient Position: Sitting, Cuff Size: Normal)   Pulse 80   Resp 16   Ht 5' 2 (1.575 m)   Wt 130 lb (59 kg)   LMP 07/26/2024 Comment: lasted 6 days, still having a little today  BMI 23.78 kg/m   Physical Exam   Assessment & Plan  Encouraged influenza and COVID vaccinations at pharmacy of choice or return here

## 2024-09-07 ENCOUNTER — Ambulatory Visit: Payer: Self-pay

## 2024-10-06 ENCOUNTER — Other Ambulatory Visit: Payer: Self-pay

## 2024-10-10 ENCOUNTER — Other Ambulatory Visit: Payer: Self-pay

## 2024-10-13 ENCOUNTER — Other Ambulatory Visit: Payer: Self-pay

## 2024-10-22 ENCOUNTER — Telehealth: Payer: Self-pay | Admitting: Internal Medicine

## 2024-10-25 NOTE — Telephone Encounter (Signed)
 Patient states she did not go to the health department to apply for medication because she is not sure if she wants to inject her self.  Notified patient she was also to apply for Jardiance  which is a tablet but patient continue to say that she does not plan to get this injection.  Patient states she would like to talk to doctor about this because she does not want to use injection and would like to ask more about medication. And to see if doctor would like to continue with her care as she does not plan to obtain injection.

## 2024-10-27 ENCOUNTER — Other Ambulatory Visit: Payer: Self-pay

## 2024-10-27 DIAGNOSIS — E7800 Pure hypercholesterolemia, unspecified: Secondary | ICD-10-CM

## 2025-01-05 ENCOUNTER — Other Ambulatory Visit: Payer: Self-pay

## 2025-01-09 ENCOUNTER — Encounter: Payer: Self-pay | Admitting: Internal Medicine
